# Patient Record
Sex: Female | Born: 1935 | Race: White | Hispanic: No | Smoking: Former smoker
Health system: Southern US, Community
[De-identification: ages and names within clinical notes are randomized; demographics above are authoritative.]

## PROBLEM LIST (undated history)

## (undated) DIAGNOSIS — E271 Primary adrenocortical insufficiency: Secondary | ICD-10-CM

## (undated) DIAGNOSIS — E785 Hyperlipidemia, unspecified: Secondary | ICD-10-CM

## (undated) DIAGNOSIS — G43909 Migraine, unspecified, not intractable, without status migrainosus: Secondary | ICD-10-CM

## (undated) DIAGNOSIS — E039 Hypothyroidism, unspecified: Secondary | ICD-10-CM

## (undated) HISTORY — DX: Hypothyroidism, unspecified: E03.9

## (undated) HISTORY — DX: Migraine, unspecified, not intractable, without status migrainosus: G43.909

## (undated) HISTORY — DX: Primary adrenocortical insufficiency: E27.1

## (undated) HISTORY — PX: OTHER SURGICAL HISTORY: SHX169

## (undated) HISTORY — DX: Hyperlipidemia, unspecified: E78.5

---

## 2004-11-20 ENCOUNTER — Encounter: Admission: RE | Admit: 2004-11-20 | Discharge: 2004-11-20 | Payer: Self-pay | Admitting: Family Medicine

## 2008-02-05 ENCOUNTER — Inpatient Hospital Stay (HOSPITAL_COMMUNITY): Admission: EM | Admit: 2008-02-05 | Discharge: 2008-02-08 | Payer: Self-pay | Admitting: Emergency Medicine

## 2008-10-27 ENCOUNTER — Ambulatory Visit: Payer: Self-pay | Admitting: Gastroenterology

## 2009-01-24 ENCOUNTER — Encounter: Payer: Self-pay | Admitting: Cardiology

## 2011-02-26 NOTE — Discharge Summary (Signed)
[name], [name]               ACCOUNT NO.:  [id number]   MEDICAL RECORD NO.:  [id number]          PATIENT TYPE:  INP   LOCATION:  5034                         FACILITY:  [facility]   PHYSICIAN:  Hettie Holstein, D.O.    DATE OF BIRTH:  05/02/1932   DATE OF ADMISSION:  02/05/2008  DATE OF DISCHARGE:  02/08/2008                               DISCHARGE SUMMARY   PRIMARY CARE PHYSICIAN:  Dr. Bayard Beaver. Collins Scotland, M.D.   FINAL DIAGNOSES:  A gram-negative rod sepsis of uncertain etiology,  final CNS is pending at time of discharge.  The patient requested  discharge home.  She is clinically stabilized and no local sources  identified.  She has received Rocephin and has responded clinically and  will be transitioned to Keflex.  I have contacted Dr. Herb Grays to  follow up the results of these final cultures and also provided the  patient with our number so that she can be contacted.  I have also  obtained her phone number to contact in case we find an isolate that is  non susceptible to her discharge regimen.  Her phone number is [phone].  1. Addison's disease with a mild crisis exacerbated by underlying      infection.  She has responded to a pulse stress dose of her      glucocorticoid which she can continue into the outpatient setting      upon followup.  If complete resolution of her symptoms and a      susceptible isolate is identified this can be tapered.  2. Strep pharyngitis.  She had sore throat and underwent a rapid strep      screen which was positive in the emergency department.  She      responded to Rocephin as noted above.  Additionally, she can      conclude a course of treatment with Keflex.  3. Hypothyroidism.   MEDICATIONS:  On discharge, Florinef she can continue at 0.05 mg daily,  hydrocortisone this is being increased at 30 mg q.a.m. and 20 mg p.o.  nightly, and levothyroxine 125 mcg daily as well as Keflex 500 mg p.o.  b.i.d.   DISPOSITION:  She is ambulatory,  clinically stable appearing condition.  With close followup should be able to discharge home.  I have contacted  her primary care physician and also I have obtained her contact  information in any event results of the cultures are unexpected.   HISTORY OF PRESENT ILLNESS:  For full details please refer to the H&P as  dictated by Dr. Isidor Holts however, briefly, Ms. [name] is a  pleasant 77 year old female who has known history of Addison's disease  on glucocorticoid and mineralocorticoid replacement.  She was feeling  quite well in her usual state of health up until the evening of April 23  when she developed a sore throat.  She denied nasal congestion and  denied cough.  She did feel febrile and took some Tylenol.  She  experienced some chills in the morning of April 24.  She vomited twice.  She denied abdominal  pain or diarrhea.  She contacted her primary MD, he  referred her to the emergency department.  She denied any sick contacts.   PAST MEDICAL HISTORY:  History of Addison's disease since age 34,  hypothyroidism, and hypertriglyceridemia.   HOSPITAL COURSE:  She was admitted and underwent initiation of treatment  for strep pharyngitis with Rocephin and intravenous fluids.  She  underwent a stress dosing of her hydrocortisone b.i.d.  She did exhibit  some orthostasis, hypotension, and fevers though she responded promptly  to IV fluids and pulse dosing.  Her blood cultures did reveal a  preliminary result gram-negative rods.  The final strain and  susceptibility is currently pending.  Ms. Gregg is clinically  appearing quite resolved, well and desires to go home and assures that  she will closely  followup with her primary care physician.  I have provided contact  information for her.  I will contact her primary care physician.  I have  obtained her contact information and we will follow her closely in the  event that these are not resolved prior to her following up with  her  primary care physician.      Hettie Holstein, D.O.  Electronically Signed     ESS/MEDQ  D:  02/08/2008  T:  02/09/2008  Job:  [id number]   cc:   Tammy R. Collins Scotland, M.D.

## 2011-02-26 NOTE — H&P (Signed)
[name], [name]               ACCOUNT NO.:  [id number]   MEDICAL RECORD NO.:  [id number]          PATIENT TYPE:  EMS   LOCATION:  MAJO                         FACILITY:  [facility]   PHYSICIAN:  Isidor Holts, M.D.  DATE OF BIRTH:  06/14/1932   DATE OF ADMISSION:  02/05/2008  DATE OF DISCHARGE:                              HISTORY & PHYSICAL   PRIMARY MEDICAL DOCTOR:  Tammy R. Collins Scotland, M.D.   CHIEF COMPLAINT:  Sore throat and fever for approximately 2 days,  vomiting for 1 day.   HISTORY OF PRESENT ILLNESS:  This is a 77 year old female. For past  medical history, see below.  The patient has a known history Addison  disease, on steroid replacement treatment.  According to her, she was  feeling quite well and in her usual state of health, up until  approximately 6 p.m. on February 04, 2008, when she developed a sore  throat.  She denies nasal congestion, denies cough.  She did, however,  feel febrile and had to take a couple of Tylenol overnight.  She  experienced chills in the a.m. of February 05, 2008 at about 8:30 to 8:45  a.m.  She took some Tylenol pills and started vomiting.  She vomited  twice, denies abdominal pain or diarrhea.  She contacted her M.D. who  referred her to the emergency department.  The patient denies any sick  contacts.  She denies chest pain or shortness of breath.   PAST MEDICAL HISTORY:  1. Addison disease since age 89 years.  2. Hypothyroidism.  3. Hypertriglyceridemia.   MEDICATION HISTORY:  1. Fludrocortisone 0.05 mg p.o. daily.  2. Hydrocortisone 20 mg p.o. every morning and 10 mg p.o. every      evening.  3. Levothyroxine 125 mcg p.o. daily.  4. Omega-3 fatty acids 1000 mg p.o. b.i.d.   ALLERGIES:  CODEINE, this causes nausea and vomiting.   SYSTEMS REVIEW:  As per HPI and chief complaint, otherwise negative.   SOCIAL HISTORY:  The patient is a retired Art gallery manager.  She has been  widowed for approximately 6 years.  She is an ex-smoker, quit  approximately 30 years ago.  Drinks alcohol only occasionally.  Has no  history of drug abuse.   FAMILY HISTORY:  The patient has two offspring.  Mother is age 40 years  and is alive and well.  Her father died in his 16s of carcinoma of the  lung.  He was a heavy smoker.  Family history is otherwise  noncontributory.   PHYSICAL EXAMINATION:  VITAL SIGNS:  Temperature 101.3, pulse 81 per  minute, respiratory rate 18, BP initially 93/56-mmHg, after normal  saline bolus administered intravenously in the emergency department  rechecked at 102/57-mmHg.  Pulse oximeter 97% on room air.  GENERAL:  The patient did not appear to be in obvious acute discomfort  at the time of this evaluation, alert, communicative, not short of  breath at rest.  HEENT:  No clinical pallor.  No jaundice.  No conjunctival injection.  Throat, visible mucous membranes are dry.  There was mild faucal  hyperemia, mild tonsillar enlargement,  but no exudate.  NECK:  Supple.  JVP not seen.  No palpable lymphadenopathy.  No palpable  goiter.  CHEST:  Clinically clear to auscultation.  No wheezes.  No crackles.  HEART:  Sounds 1 and 2 heard.  Normal regular.  No murmurs.  ABDOMEN:  Full, soft, nontender.  No palpable organomegaly.  No palpable  masses.  Normal bowel sounds.  LOWER EXTREMITIES:  No pitting edema.  Palpable peripheral pulses.  MUSCULOSKELETAL:  Unremarkable.  CENTRAL NERVOUS SYSTEM:  No focal neurologic deficits on gross  examination.   INVESTIGATIONS:  CBC:  WBC 9.7, hemoglobin 13.5, hematocrit 39.4,  platelets 230.  Electrolytes:  Sodium 135, potassium 4, chloride 101,  CO2 24, BUN 19, creatinine 1.01, glucose 91.  AST 36, ALT 23, alkaline  phosphatase 51, lipase 19.  Urinalysis is negative.  Rapid strep test  done in the emergency department was positive for Group A Streptococcus.   ASSESSMENT/PLAN:  1. Acute Group A Streptococcal tonsillopharyngitis.  We shall manage      with intravenous Rocephin  and utilize as needed analgesics.   1. Vomiting/mild dehydration.  We shall manage with intravenous      hydration with normal saline.   1. Addisonian crisis.  This appears to be mild.  We shall manage with      parenteral supraphysiologic steroid treatment as well as      intravenous fluids, as outlined above.   1. Hypotension.  This is secondary to #s 2 and 3, and should respond      to above management measures.   1. Hypothyroidism.  We shall check thyroid profile and meanwhile      administer Synthroid intravenously in equivalent dose to continue      pre-admission regimen.   1. History of hypertriglyceridemia.  We will check a lipid profile.   Further management will depend on clinical course.      Isidor Holts, M.D.  Electronically Signed     CO/MEDQ  D:  02/05/2008  T:  02/05/2008  Job:  [id number]   cc:   Tammy R. Collins Scotland, M.D.

## 2011-07-09 LAB — CULTURE, BLOOD (ROUTINE X 2): Culture: NO GROWTH

## 2011-07-09 LAB — BASIC METABOLIC PANEL
BUN: 11
BUN: 11
BUN: 7
CO2: 24
Calcium: 8 — ABNORMAL LOW
Chloride: 106
Chloride: 107
Creatinine, Ser: 0.77
Creatinine, Ser: 0.79
Creatinine, Ser: 0.96
GFR calc Af Amer: >60
GFR calc Af Amer: >60
GFR calc non Af Amer: 57 — ABNORMAL LOW
Glucose, Bld: 106 — ABNORMAL HIGH

## 2011-07-09 LAB — CBC
MCHC: 35.1
MCV: 90.9
Platelets: 208
Platelets: 230
RBC: 3.56 — ABNORMAL LOW
RDW: 13.5
WBC: 11.7 — ABNORMAL HIGH

## 2011-07-09 LAB — URINE MICROSCOPIC-ADD ON

## 2011-07-09 LAB — DIFFERENTIAL
Basophils Absolute: 0.1
Eosinophils Absolute: 0.1
Eosinophils Relative: 1
Lymphocytes Relative: 17
Neutrophils Relative %: 74

## 2011-07-09 LAB — B-NATRIURETIC PEPTIDE (CONVERTED LAB)
Pro B Natriuretic peptide (BNP): 192 — ABNORMAL HIGH
Pro B Natriuretic peptide (BNP): 324 — ABNORMAL HIGH

## 2011-07-09 LAB — URINALYSIS, ROUTINE W REFLEX MICROSCOPIC
Bilirubin Urine: NEGATIVE
Specific Gravity, Urine: 1.022
pH: 5

## 2011-07-09 LAB — COMPREHENSIVE METABOLIC PANEL
Albumin: 3.6
BUN: 19
GFR calc non Af Amer: 54 — ABNORMAL LOW
Potassium: 4
Sodium: 135
Total Bilirubin: 1.2
Total Protein: 6.7

## 2011-07-09 LAB — LIPASE, BLOOD: Lipase: 19

## 2011-07-09 LAB — LIPID PANEL
Cholesterol: 153
LDL Cholesterol: 81
Triglycerides: 101

## 2011-07-09 LAB — RAPID STREP SCREEN (MED CTR MEBANE ONLY): Streptococcus, Group A Screen (Direct): POSITIVE — AB

## 2011-07-09 LAB — TSH: TSH: 0.554

## 2011-08-27 ENCOUNTER — Ambulatory Visit: Payer: Medicare Other | Attending: Internal Medicine | Admitting: Physical Therapy

## 2011-08-27 DIAGNOSIS — R293 Abnormal posture: Secondary | ICD-10-CM | POA: Insufficient documentation

## 2011-08-27 DIAGNOSIS — IMO0001 Reserved for inherently not codable concepts without codable children: Secondary | ICD-10-CM | POA: Insufficient documentation

## 2011-09-11 ENCOUNTER — Encounter: Payer: Medicare Other | Admitting: Physical Therapy

## 2011-09-12 ENCOUNTER — Encounter: Payer: Medicare Other | Admitting: Physical Therapy

## 2011-09-17 ENCOUNTER — Encounter: Payer: Medicare Other | Admitting: Physical Therapy

## 2011-09-19 ENCOUNTER — Encounter: Payer: Medicare Other | Admitting: Physical Therapy

## 2011-09-24 ENCOUNTER — Ambulatory Visit: Payer: Medicare Other | Attending: Internal Medicine | Admitting: Physical Therapy

## 2011-09-24 DIAGNOSIS — R293 Abnormal posture: Secondary | ICD-10-CM | POA: Insufficient documentation

## 2011-09-24 DIAGNOSIS — IMO0001 Reserved for inherently not codable concepts without codable children: Secondary | ICD-10-CM | POA: Insufficient documentation

## 2011-10-02 ENCOUNTER — Ambulatory Visit: Payer: Medicare Other | Admitting: Physical Therapy

## 2011-10-23 ENCOUNTER — Ambulatory Visit: Payer: Medicare Other | Attending: Internal Medicine | Admitting: Physical Therapy

## 2011-10-23 DIAGNOSIS — IMO0001 Reserved for inherently not codable concepts without codable children: Secondary | ICD-10-CM | POA: Insufficient documentation

## 2011-10-23 DIAGNOSIS — R293 Abnormal posture: Secondary | ICD-10-CM | POA: Insufficient documentation

## 2011-11-06 ENCOUNTER — Ambulatory Visit: Payer: Medicare Other | Admitting: Physical Therapy

## 2012-06-30 ENCOUNTER — Emergency Department (HOSPITAL_COMMUNITY)
Admission: EM | Admit: 2012-06-30 | Discharge: 2012-06-30 | Disposition: A | Discharge status: Home / Self Care | Payer: Medicare Other | Attending: Emergency Medicine | Admitting: Emergency Medicine

## 2012-06-30 ENCOUNTER — Encounter (HOSPITAL_COMMUNITY): Payer: Self-pay

## 2012-06-30 DIAGNOSIS — R197 Diarrhea, unspecified: Secondary | ICD-10-CM | POA: Insufficient documentation

## 2012-06-30 DIAGNOSIS — E039 Hypothyroidism, unspecified: Secondary | ICD-10-CM | POA: Insufficient documentation

## 2012-06-30 DIAGNOSIS — M810 Age-related osteoporosis without current pathological fracture: Secondary | ICD-10-CM | POA: Insufficient documentation

## 2012-06-30 DIAGNOSIS — K529 Noninfective gastroenteritis and colitis, unspecified: Secondary | ICD-10-CM

## 2012-06-30 DIAGNOSIS — E785 Hyperlipidemia, unspecified: Secondary | ICD-10-CM | POA: Insufficient documentation

## 2012-06-30 DIAGNOSIS — E2749 Other adrenocortical insufficiency: Secondary | ICD-10-CM | POA: Insufficient documentation

## 2012-06-30 DIAGNOSIS — Z87891 Personal history of nicotine dependence: Secondary | ICD-10-CM | POA: Insufficient documentation

## 2012-06-30 DIAGNOSIS — R1110 Vomiting, unspecified: Secondary | ICD-10-CM | POA: Insufficient documentation

## 2012-06-30 LAB — URINALYSIS, ROUTINE W REFLEX MICROSCOPIC
Nitrite: NEGATIVE
Protein, ur: NEGATIVE mg/dL
Specific Gravity, Urine: 1.023 (ref 1.005–1.030)
Urobilinogen, UA: 0.2 mg/dL (ref 0.0–1.0)

## 2012-06-30 LAB — CBC WITH DIFFERENTIAL/PLATELET
Basophils Absolute: 0 10*3/uL (ref 0.0–0.1)
Eosinophils Relative: 1 % (ref 0–5)
HCT: 43.8 % (ref 36.0–46.0)
Lymphocytes Relative: 19 % (ref 12–46)
Lymphs Abs: 2.2 10*3/uL (ref 0.7–4.0)
MCV: 91.6 fL (ref 78.0–100.0)
Monocytes Absolute: 0.4 10*3/uL (ref 0.1–1.0)
Monocytes Relative: 4 % (ref 3–12)
RDW: 13.2 % (ref 11.5–15.5)
WBC: 11.6 10*3/uL — ABNORMAL HIGH (ref 4.0–10.5)

## 2012-06-30 LAB — COMPREHENSIVE METABOLIC PANEL
BUN: 25 mg/dL — ABNORMAL HIGH (ref 6–23)
CO2: 30 mEq/L (ref 19–32)
Calcium: 10 mg/dL (ref 8.4–10.5)
Creatinine, Ser: 1.01 mg/dL (ref 0.50–1.10)
GFR calc Af Amer: 60 mL/min — ABNORMAL LOW (ref >90)
GFR calc non Af Amer: 52 mL/min — ABNORMAL LOW (ref >90)
Glucose, Bld: 95 mg/dL (ref 70–99)

## 2012-06-30 LAB — URINE MICROSCOPIC-ADD ON

## 2012-06-30 MED ORDER — SODIUM CHLORIDE 0.9 % IV BOLUS (SEPSIS)
500.0000 mL | Freq: Once | INTRAVENOUS | Status: DC
Start: 2012-06-30 — End: 2012-07-01

## 2012-06-30 MED ORDER — IBUPROFEN 400 MG PO TABS
400.0000 mg | ORAL_TABLET | Freq: Once | ORAL | Status: AC
  Administered 2012-06-30: 400 mg via ORAL
  Filled 2012-06-30: qty 1

## 2012-06-30 MED ORDER — ONDANSETRON 4 MG PO TBDP: 4.0000 mg | ORAL_TABLET | Freq: Three times a day (TID) | ORAL | Status: DC | PRN

## 2012-06-30 MED ORDER — ONDANSETRON 4 MG PO TBDP
8.0000 mg | ORAL_TABLET | Freq: Once | ORAL | Status: AC
  Administered 2012-06-30: 8 mg via ORAL
  Filled 2012-06-30: qty 2

## 2012-06-30 NOTE — ED Provider Notes (Signed)
History     CSN: [id number]  Arrival date & time 06/30/12  4696   First MD Initiated Contact with Patient 06/30/12 2106      Chief Complaint  Patient presents with  . Emesis  . Diarrhea    (Consider location/radiation/quality/duration/timing/severity/associated sxs/prior treatment) HPI Pt with history of Addison's disease has onset of multiple episodes of vomiting and diarrhea earlier this afternoon. She denies any blood in emesis or stool. No fever or abdominal pain. She is concerned that her symptoms may be an Addisonian crisis, but has been taking her medications without difficulty.   Past Medical History  Diagnosis Date  . Hypothyroidism   . Addison's disease   . Osteoporosis   . Migraines     opthalmic migraines  . Dyslipidemia     Past Surgical History  Procedure Date  . Ganglion cyst foot     Family History  Problem Relation Age of Onset  . Lung cancer Father   . Angina Father   . Hypertension Brother   . Ovarian cancer Sister     History  Substance Use Topics  . Smoking status: Former Smoker -- 0.5 packs/day for 7 years    Types: Cigarettes    Quit date: 01/30/1979  . Smokeless tobacco: Not on file  . Alcohol Use: 3.5 oz/week    7 drink(s) per week    OB History    Grav Para Term Preterm Abortions TAB SAB Ect Mult Living                  Review of Systems All other systems reviewed and are negative except as noted in HPI.   Allergies  Review of patient's allergies indicates no known allergies.  Home Medications   Current Outpatient Rx  Name Route Sig Dispense Refill  . CALCIUM CARBONATE 600 MG PO TABS Oral Take 600 mg by mouth 2 (two) times daily with a meal.    . FLUDROCORTISONE ACETATE PO Oral Take 1 tablet by mouth daily.    Marland Kitchen HYDROCORTISONE 20 MG PO TABS Oral Take 5-10 mg by mouth daily. One half tab am and one tab pm    . LEVOTHYROXINE SODIUM 88 MCG PO TABS Oral Take 88 mcg by mouth daily.    Marland Kitchen NAPROXEN SODIUM 220 MG PO TABS Oral  Take 220 mg by mouth daily as needed. For pain    . OMEGA-3-ACID ETHYL ESTERS 1 G PO CAPS Oral Take 1 g by mouth 2 (two) times daily.      BP 137/58  Pulse 94  Temp 98.1 F (36.7 C) (Oral)  Resp 18  SpO2 96%  Physical Exam  Nursing note and vitals reviewed. Constitutional: She is oriented to person, place, and time. She appears well-developed and well-nourished.  HENT:  Head: Normocephalic and atraumatic.  Eyes: EOM are normal. Pupils are equal, round, and reactive to light.  Neck: Normal range of motion. Neck supple.  Cardiovascular: Normal rate, normal heart sounds and intact distal pulses.   Pulmonary/Chest: Effort normal and breath sounds normal.  Abdominal: Bowel sounds are normal. She exhibits no distension. There is no tenderness.  Musculoskeletal: Normal range of motion. She exhibits no edema and no tenderness.  Neurological: She is alert and oriented to person, place, and time. She has normal strength. No cranial nerve deficit or sensory deficit.  Skin: Skin is warm and dry. No rash noted.  Psychiatric: She has a normal mood and affect.    ED Course  Procedures (including critical  care time)  Labs Reviewed  CBC WITH DIFFERENTIAL - Abnormal; Notable for the following:    WBC 11.6 (*)     Neutro Abs 8.9 (*)     All other components within normal limits  COMPREHENSIVE METABOLIC PANEL - Abnormal; Notable for the following:    BUN 25 (*)     GFR calc non Af Amer 52 (*)     GFR calc Af Amer 60 (*)     All other components within normal limits  URINALYSIS, ROUTINE W REFLEX MICROSCOPIC - Abnormal; Notable for the following:    APPearance HAZY (*)     Hgb urine dipstick SMALL (*)     Ketones, ur 15 (*)     Leukocytes, UA TRACE (*)     All other components within normal limits  URINE MICROSCOPIC-ADD ON - Abnormal; Notable for the following:    Casts HYALINE CASTS (*)     All other components within normal limits   No results found.   No diagnosis found.    MDM    Labs in the ED are unremarkable. No significant hypotension. Doubt Addisonian Crisis. Her symptoms are resolved with Zofran. Attempting PO trial now and if tolerates, plan to D/C home. Asking for NSAID for bursitis.         Wania Longstreth B. Bernette Mayers, MD 06/30/12 2237

## 2012-06-30 NOTE — ED Notes (Signed)
Continuous vomiting in triage

## 2012-06-30 NOTE — ED Notes (Signed)
Pt reports she has a hx of Addison's disease, approx 1645 today pt began having N/V/D, pt reports vomiting x6 and diarrhea x3. Pt denies chest/back/abd pain

## 2012-11-03 ENCOUNTER — Emergency Department (HOSPITAL_COMMUNITY)
Admission: EM | Admit: 2012-11-03 | Discharge: 2012-11-03 | Disposition: A | Discharge status: Home / Self Care | Payer: Medicare Other | Attending: Emergency Medicine | Admitting: Emergency Medicine

## 2012-11-03 ENCOUNTER — Encounter (HOSPITAL_COMMUNITY): Payer: Self-pay

## 2012-11-03 ENCOUNTER — Emergency Department (HOSPITAL_COMMUNITY): Payer: Medicare Other

## 2012-11-03 DIAGNOSIS — R112 Nausea with vomiting, unspecified: Secondary | ICD-10-CM | POA: Insufficient documentation

## 2012-11-03 DIAGNOSIS — E785 Hyperlipidemia, unspecified: Secondary | ICD-10-CM | POA: Insufficient documentation

## 2012-11-03 DIAGNOSIS — Z8679 Personal history of other diseases of the circulatory system: Secondary | ICD-10-CM | POA: Insufficient documentation

## 2012-11-03 DIAGNOSIS — Z87891 Personal history of nicotine dependence: Secondary | ICD-10-CM | POA: Insufficient documentation

## 2012-11-03 DIAGNOSIS — E039 Hypothyroidism, unspecified: Secondary | ICD-10-CM | POA: Insufficient documentation

## 2012-11-03 DIAGNOSIS — Z79899 Other long term (current) drug therapy: Secondary | ICD-10-CM | POA: Insufficient documentation

## 2012-11-03 DIAGNOSIS — E2749 Other adrenocortical insufficiency: Secondary | ICD-10-CM | POA: Insufficient documentation

## 2012-11-03 DIAGNOSIS — M810 Age-related osteoporosis without current pathological fracture: Secondary | ICD-10-CM | POA: Insufficient documentation

## 2012-11-03 LAB — URINALYSIS, ROUTINE W REFLEX MICROSCOPIC
Glucose, UA: NEGATIVE mg/dL
Ketones, ur: NEGATIVE mg/dL
Nitrite: NEGATIVE
Protein, ur: NEGATIVE mg/dL
Urobilinogen, UA: 0.2 mg/dL (ref 0.0–1.0)

## 2012-11-03 LAB — COMPREHENSIVE METABOLIC PANEL
AST: 30 U/L (ref 0–37)
BUN: 24 mg/dL — ABNORMAL HIGH (ref 6–23)
CO2: 29 mEq/L (ref 19–32)
Calcium: 9.4 mg/dL (ref 8.4–10.5)
Chloride: 98 mEq/L (ref 96–112)
Creatinine, Ser: 0.96 mg/dL (ref 0.50–1.10)
GFR calc Af Amer: 64 mL/min — ABNORMAL LOW (ref >90)
GFR calc non Af Amer: 55 mL/min — ABNORMAL LOW (ref >90)
Total Bilirubin: 0.5 mg/dL (ref 0.3–1.2)

## 2012-11-03 LAB — CBC WITH DIFFERENTIAL/PLATELET
Basophils Absolute: 0 10*3/uL (ref 0.0–0.1)
Eosinophils Relative: 1 % (ref 0–5)
HCT: 42.5 % (ref 36.0–46.0)
Hemoglobin: 14.4 g/dL (ref 12.0–15.0)
Lymphocytes Relative: 12 % (ref 12–46)
MCHC: 33.9 g/dL (ref 30.0–36.0)
MCV: 91.4 fL (ref 78.0–100.0)
Monocytes Absolute: 0.4 10*3/uL (ref 0.1–1.0)
Monocytes Relative: 6 % (ref 3–12)
RDW: 13.5 % (ref 11.5–15.5)
WBC: 7.1 10*3/uL (ref 4.0–10.5)

## 2012-11-03 LAB — LIPASE, BLOOD: Lipase: 130 U/L — ABNORMAL HIGH (ref 11–59)

## 2012-11-03 LAB — TROPONIN I: Troponin I: <0.3 ng/mL (ref <0.30)

## 2012-11-03 MED ORDER — SODIUM CHLORIDE 0.9 % IV BOLUS (SEPSIS)
250.0000 mL | Freq: Once | INTRAVENOUS | Status: AC
  Administered 2012-11-03: 250 mL via INTRAVENOUS

## 2012-11-03 MED ORDER — ONDANSETRON HCL 4 MG/2ML IJ SOLN
4.0000 mg | Freq: Once | INTRAMUSCULAR | Status: AC
  Administered 2012-11-03: 4 mg via INTRAVENOUS
  Filled 2012-11-03: qty 2

## 2012-11-03 MED ORDER — SODIUM CHLORIDE 0.9 % IV SOLN
Freq: Once | INTRAVENOUS | Status: AC
  Administered 2012-11-03: 10:00:00 via INTRAVENOUS

## 2012-11-03 NOTE — ED Notes (Signed)
Pt transported to radiology.

## 2012-11-03 NOTE — ED Notes (Signed)
Patient is able to ambulate without any problems. 

## 2012-11-03 NOTE — ED Notes (Signed)
Pt. Woke up with n/v vomited about 7 times.  Denies any abdominal pain

## 2012-11-03 NOTE — ED Provider Notes (Signed)
History     CSN: [id number]  Arrival date & time 11/03/12  4098   First MD Initiated Contact with Patient 11/03/12 0848      Chief Complaint  Patient presents with  . Emesis    Patient is a 79 y.o. female presenting with vomiting. The history is provided by the patient and a relative.  Emesis  This is a new problem. Episode onset: just prior to arrival. The problem occurs 5 to 10 times per day. The problem has been gradually worsening. The emesis has an appearance of stomach contents. There has been no fever. Pertinent negatives include no abdominal pain, no diarrhea and no fever. Risk factors: unknown.  pt reports abrupt onset of nauseas/vomiting.  No diarrhea.  No blood reported in her vomitus.  No cp/sob.  No abd pain.  She reports she has had this before as he has h/o addisons disease.  She has not missed any steroid treatments   Past Medical History  Diagnosis Date  . Hypothyroidism   . Addison's disease   . Osteoporosis   . Migraines     opthalmic migraines  . Dyslipidemia     Past Surgical History  Procedure Date  . Ganglion cyst foot     Family History  Problem Relation Age of Onset  . Lung cancer Father   . Angina Father   . Hypertension Brother   . Ovarian cancer Sister     History  Substance Use Topics  . Smoking status: Former Smoker -- 0.5 packs/day for 7 years    Types: Cigarettes    Quit date: 01/30/1979  . Smokeless tobacco: Not on file  . Alcohol Use: 3.5 oz/week    7 drink(s) per week    OB History    Grav Para Term Preterm Abortions TAB SAB Ect Mult Living                  Review of Systems  Constitutional: Negative for fever.  Respiratory: Negative for shortness of breath.   Cardiovascular: Negative for chest pain.  Gastrointestinal: Positive for vomiting. Negative for abdominal pain and diarrhea.  Neurological: Negative for syncope.  Psychiatric/Behavioral: Negative for agitation.  All other systems reviewed and are  negative.    Allergies  Review of patient's allergies indicates no known allergies.  Home Medications   Current Outpatient Rx  Name  Route  Sig  Dispense  Refill  . CALCIUM PO   Oral   Take 1 tablet by mouth daily.         Marland Kitchen FLUDROCORTISONE ACETATE 0.1 MG PO TABS   Oral   Take 0.05 mg by mouth daily.         Marland Kitchen HYDROCORTISONE 10 MG PO TABS   Oral   Take 5-15 mg by mouth 2 (two) times daily. Take 1 & 1/2 tabs between 6-8 am & 1/2 tab between 3-5 pm (double dose on sick days)         . LEVOTHYROXINE SODIUM 88 MCG PO TABS   Oral   Take 88 mcg by mouth daily.         Marland Kitchen NAPROXEN SODIUM 220 MG PO TABS   Oral   Take 220 mg by mouth daily as needed. For pain         . OMEGA-3-ACID ETHYL ESTERS 1 G PO CAPS   Oral   Take 1 g by mouth daily.          Marland Kitchen ONDANSETRON 4 MG PO TBDP  Oral   Take 1 tablet (4 mg total) by mouth every 8 (eight) hours as needed for nausea.   20 tablet   0     BP 114/58  Pulse 85  Temp 98.2 F (36.8 C) (Oral)  Resp 18  SpO2 94% BP 100/47  Pulse 96  Temp 98.2 F (36.8 C) (Oral)  Resp 18  SpO2 98%   Physical Exam CONSTITUTIONAL: Well developed/well nourished HEAD AND FACE: Normocephalic/atraumatic EYES: EOMI/PERRL, no icterus ENMT: Mucous membranes dry NECK: supple no meningeal signs SPINE:entire spine nontender CV: S1/S2 noted, no murmurs/rubs/gallops noted LUNGS: Lungs are clear to auscultation bilaterally, no apparent distress ABDOMEN: soft, nontender, no rebound or guarding GU:no cva tenderness NEURO: Pt is awake/alert, moves all extremitiesx4 EXTREMITIES: pulses normal, full ROM SKIN: warm, color normal PSYCH: no abnormalities of mood noted  ED Course  Procedures  Labs Reviewed  CBC WITH DIFFERENTIAL - Abnormal; Notable for the following:    Neutrophils Relative 81 (*)     All other components within normal limits  COMPREHENSIVE METABOLIC PANEL - Abnormal; Notable for the following:    Glucose, Bld 101 (*)      BUN 24 (*)     GFR calc non Af Amer 55 (*)     GFR calc Af Amer 64 (*)     All other components within normal limits  LIPASE, BLOOD - Abnormal; Notable for the following:    Lipase 130 (*)     All other components within normal limits   10:07 AM Doubt addisonian crisis.  Labs reassuring thus far. 11:39 AM Spoke to dr Sharl Ma, on for endocrinology (who this patient sees) Recommends CXR/urine/ischemic eval If unremarkable, increase cortef to 15mg  TID this week and he will call for appointment Pt is feeling improved 1:56 PM Pt improved.  She denies pain.  No active vomiting.  She is ambulatory Stable for d/c  MDM  Nursing notes including past medical history and social history reviewed and considered in documentation Labs/vital reviewed and considered Previous records reviewed and considered - previous ED visits reviewed xrays reviewed and considered        Date: 11/03/2012  Rate: 86  Rhythm: normal sinus rhythm  QRS Axis: normal  Intervals: normal  ST/T Wave abnormalities: nonspecific ST changes  Conduction Disutrbances:left bundle branch block  Narrative Interpretation: pvc noted  Old EKG Reviewed: no significant change    Joya Gaskins, MD 11/03/12 1356

## 2012-11-03 NOTE — ED Notes (Signed)
Pt returned from radiology.

## 2012-11-03 NOTE — ED Notes (Signed)
IV nurse at the bedside

## 2012-11-03 NOTE — ED Notes (Signed)
Pt tolerating po ice chips

## 2012-11-03 NOTE — ED Notes (Signed)
Pt. Reports, "I feel a little better." Pt. Asked for something to  Drink, gave her a mouth swab.  Will see how she does with that. And then will offer fluids.

## 2013-01-19 ENCOUNTER — Other Ambulatory Visit (HOSPITAL_COMMUNITY): Payer: Self-pay | Admitting: Urology

## 2013-01-19 DIAGNOSIS — N281 Cyst of kidney, acquired: Secondary | ICD-10-CM

## 2013-02-19 ENCOUNTER — Ambulatory Visit (HOSPITAL_COMMUNITY): Payer: Medicare Other

## 2019-07-23 ENCOUNTER — Ambulatory Visit: Payer: Self-pay | Admitting: Cardiology

## 2020-05-31 ENCOUNTER — Telehealth: Payer: Self-pay

## 2020-05-31 NOTE — Telephone Encounter (Signed)
Pt called to inform us that she had some flutter and heart pounding last night. Pt mention that her PCP informed her to go to the ER or an Urgent Care. Please advise

## 2020-06-01 ENCOUNTER — Telehealth: Payer: Self-pay

## 2020-06-01 NOTE — Telephone Encounter (Signed)
Send message to lucia to call pt to make an appt

## 2020-06-01 NOTE — Telephone Encounter (Signed)
If no further symptoms, just watch, otherwise have a monitor placed. Also needs an OV and I do not recollect when I had last seen her

## 2020-06-13 ENCOUNTER — Other Ambulatory Visit: Payer: Self-pay

## 2020-06-13 ENCOUNTER — Encounter: Payer: Self-pay | Admitting: Cardiology

## 2020-06-13 ENCOUNTER — Ambulatory Visit: Payer: Medicare Other | Admitting: Cardiology

## 2020-06-13 ENCOUNTER — Ambulatory Visit: Payer: Medicare Other

## 2020-06-13 VITALS — BP 128/65 | HR 74 | Resp 16 | Ht 67.0 in | Wt 129.6 lb

## 2020-06-13 DIAGNOSIS — I447 Left bundle-branch block, unspecified: Secondary | ICD-10-CM

## 2020-06-13 DIAGNOSIS — R0609 Other forms of dyspnea: Secondary | ICD-10-CM

## 2020-06-13 DIAGNOSIS — E039 Hypothyroidism, unspecified: Secondary | ICD-10-CM

## 2020-06-13 DIAGNOSIS — R002 Palpitations: Secondary | ICD-10-CM

## 2020-06-13 DIAGNOSIS — E271 Primary adrenocortical insufficiency: Secondary | ICD-10-CM

## 2020-06-13 DIAGNOSIS — Z87891 Personal history of nicotine dependence: Secondary | ICD-10-CM

## 2020-06-13 NOTE — Progress Notes (Signed)
Irish Lack Date of Birth: 06-08-1932 MRN: [id number] Primary Care Provider:Badger, Rebeca Alert, MD Former Cardiology Providers: Dr. Adrian Prows Primary Cardiologist: Rex Kras, DO, [facility] (established care 06/13/2020)  Date: 06/13/2020 Last Visit: 07/22/2018  Chief Complaint  Patient presents with  . Follow-up    2 year  . Palpitations  . Shortness of Breath    HPI  [name] is a 86 y.o.  female who presents to the office with a chief complaint of " palpitations and shortness of breath." Patient's past medical history and cardiovascular risk factors include: Mild to moderate mitral regurgitation, left bundle branch block, Addison's disease, osteoarthritis, osteoporosis, postmenopausal female, advanced age.  Patient presents to the office for reevaluation of palpitations and shortness of breath.  Patient states that the palpitations started to resurface in mid August 2021. The first episode was while she was asleep she woke up in the middle of the night experiencing " pounding heartbeat" with intermittent flutters. She also thought that she had a few skipped beats and her symptoms resolved within 10 to 15 minutes. No improving or worsening factors. The symptoms were self-limited. No recurrence of symptoms since then.  In regards to dyspnea on exertion patient states that she gets short of breath more quicker with day-to-day activities. Initially she was contributing to not being so active given the COVID-19 pandemic but given her recent episodes of pounding heartbeat, palpitations she is concerned about underlying cardiac conditions and is here for reevaluation.  She was last seen by my partner on 08/02/2018. She had an echocardiogram and stress test at that time and results noted below.  Denies prior history of coronary artery disease, myocardial infarction, congestive heart failure, deep venous thrombosis, pulmonary embolism, stroke, transient ischemic attack.  FUNCTIONAL  STATUS: No structured exercise program or daily routine since COVID 19 pandemic.    ALLERGIES: No Known Allergies   MEDICATION LIST PRIOR TO VISIT: Current Outpatient Medications on File Prior to Visit  Medication Sig Dispense Refill  . CALCIUM PO Take 1 tablet by mouth daily.    . fludrocortisone (FLORINEF) 0.1 MG tablet Take 0.05 mg by mouth daily.    . hydrocortisone (CORTEF) 10 MG tablet Take 5-15 mg by mouth 2 (two) times daily. Take 1 & 1/2 tabs between 6-8 am & 1/2 tab between 3-5 pm (double dose on sick days)    . levothyroxine (SYNTHROID, LEVOTHROID) 88 MCG tablet Take 88 mcg by mouth daily.    Marland Kitchen omega-3 acid ethyl esters (LOVAZA) 1 G capsule Take 1 g by mouth daily.     . ondansetron (ZOFRAN ODT) 4 MG disintegrating tablet Take 1 tablet (4 mg total) by mouth every 8 (eight) hours as needed for nausea. 20 tablet 0   No current facility-administered medications on file prior to visit.    PAST MEDICAL HISTORY: Past Medical History:  Diagnosis Date  . Addison's disease ([place])   . Hypothyroidism   . Osteoporosis     PAST SURGICAL HISTORY: Past Surgical History:  Procedure Laterality Date  . ganglion cyst foot      FAMILY HISTORY: The patient's family history includes Angina in her father; Hypertension in her brother; Lung cancer in her father; Ovarian cancer in her sister.   SOCIAL HISTORY:  The patient  reports that she quit smoking about 41 years ago. Her smoking use included cigarettes. She has a 3.50 pack-year smoking history. She has never used smokeless tobacco. She reports current alcohol use of about 1.0 standard drink  of alcohol per week. She reports that she does not use drugs.  Review of Systems  Constitutional: Negative for chills and fever.  HENT: Negative for hoarse voice and nosebleeds.   Eyes: Negative for discharge, double vision and pain.  Cardiovascular: Positive for dyspnea on exertion and palpitations. Negative for chest pain, claudication, leg  swelling, near-syncope, orthopnea, paroxysmal nocturnal dyspnea and syncope.  Respiratory: Negative for hemoptysis and shortness of breath.   Musculoskeletal: Negative for muscle cramps and myalgias.  Gastrointestinal: Negative for abdominal pain, constipation, diarrhea, hematemesis, hematochezia, melena, nausea and vomiting.  Neurological: Negative for dizziness and light-headedness.    PHYSICAL EXAM: Vitals with BMI 06/13/2020 11/03/2012 11/03/2012  Height 5\' 7"  - -  Weight 129 lbs 10 oz - -  BMI 29.79 - -  Systolic 892 119 417  Diastolic 65 47 47  Pulse 74 96 95    CONSTITUTIONAL: Well-developed and well-nourished. No acute distress.  SKIN: Skin is warm and dry. No rash noted. No cyanosis. No pallor. No jaundice HEAD: Normocephalic and atraumatic.  EYES: No scleral icterus MOUTH/THROAT: Moist oral membranes.  NECK: No JVD present. No thyromegaly noted. No carotid bruits  LYMPHATIC: No visible cervical adenopathy.  CHEST Normal respiratory effort. No intercostal retractions  LUNGS: Clear to auscultation bilaterally. No stridor. No wheezes. No rales.  CARDIOVASCULAR: Positive S1-S2, regular, soft holosystolic murmur heard at the apex, no gallops or rubs. ABDOMINAL: Nonobese, soft, nontender, nondistended, positive bowel sounds in all 4 quadrants, no apparent ascites.  EXTREMITIES: No peripheral edema  HEMATOLOGIC: No significant bruising NEUROLOGIC: Oriented to person, place, and time. Nonfocal. Normal muscle tone.  PSYCHIATRIC: Normal mood and affect. Normal behavior. Cooperative  CARDIAC DATABASE: EKG: 06/13/2020: Normal sinus rhythm, 71 bpm, poor R wave progression, left bundle branch block, ST-T changes most likely secondary to LBBB but ischemia cannot be ruled out.  Echocardiogram: 07/08/2018: LVEF 40%, grade 1 diastolic impairment, mild LVH, mild TR, mild to moderate MR, mild TR, no pulmonary hypertension.  Stress Testing:  Lexiscan stress test 06/19/2018: Homogenous tracer  uptake throughout the myocardium. SPECT images revealed normal thickening and wall motion. LVEF by gated SPECT 65%. Low risk study.  Heart Catheterization: None  LABORATORY DATA: CBC Latest Ref Rng & Units 11/03/2012 06/30/2012 02/07/2008  WBC 4.0 - 10.5 K/uL 7.1 11.6(H) 10.0  Hemoglobin 12.0 - 15.0 g/dL 14.4 15.0 11.4(L)  Hematocrit 36 - 46 % 42.5 43.8 32.4(L)  Platelets 150 - 400 K/uL 223 239 220    CMP Latest Ref Rng & Units 11/03/2012 06/30/2012 02/08/2008  Glucose 70 - 99 mg/dL 101(H) 95 99  BUN 6 - 23 mg/dL 24(H) 25(H) 11  Creatinine 0.50 - 1.10 mg/dL 0.96 1.01 0.77  Sodium 135 - 145 mEq/L 138 140 142  Potassium 3.5 - 5.1 mEq/L 4.0 3.9 3.7 DELTA CHECK NOTED  Chloride 96 - 112 mEq/L 98 100 106  CO2 19 - 32 mEq/L 29 30 28   Calcium 8.4 - 10.5 mg/dL 9.4 10.0 8.7  Total Protein 6.0 - 8.3 g/dL 7.1 7.9 -  Total Bilirubin 0.3 - 1.2 mg/dL 0.5 0.4 -  Alkaline Phos 39 - 117 U/L 61 46 -  AST 0 - 37 U/L 30 25 -  ALT 0 - 35 U/L 18 14 -    IMPRESSION:    ICD-10-CM   1. Palpitations  R00.2 EKG 12-Lead  2. Dyspnea on exertion  R06.00   3. LBBB (left bundle branch block)  I44.7   4. Former smoker  Z87.891   5.  Hypothyroidism, unspecified type  E03.9   6. Addison's disease ([place])  E27.1      RECOMMENDATIONS: SU DUMA is a 86 y.o. female whose past medical history and cardiovascular risk factors include: Mild to moderate mitral regurgitation, left bundle branch block, Addison's disease, osteoarthritis, osteoporosis, postmenopausal female, advanced age.  Palpitations: Patient has had 1 episode of palpitations which woke her up from her sleep and lasted for about 10 to 15 minutes. She denies any near-syncope or syncopal event.  Patient had an echocardiogram and Lexiscan in 2019 results were reviewed with her and noted above for further reference. Recommend extended Holter monitor to evaluate for underlying dysrhythmias. Recommend getting her most recent blood work from her PCPs office  to evaluate the most recent TSH. Otherwise we will have to repeat it. Continue to monitor.  Left bundle branch block: Continue to monitor.  Addison's disease: Medications reconciled. Chronic and stable  If the patient's extended Holter monitor does not reveal any significant dysrhythmias will consider repeating an echocardiogram and stress test given her dyspnea on exertion. Patient is agreeable with the plan of care.   FINAL MEDICATION LIST END OF ENCOUNTER: No orders of the defined types were placed in this encounter.   Medications Discontinued During This Encounter  Medication Reason  . naproxen sodium (ANAPROX) 220 MG tablet Patient Preference     Current Outpatient Medications:  .  CALCIUM PO, Take 1 tablet by mouth daily., Disp: , Rfl:  .  fludrocortisone (FLORINEF) 0.1 MG tablet, Take 0.05 mg by mouth daily., Disp: , Rfl:  .  hydrocortisone (CORTEF) 10 MG tablet, Take 5-15 mg by mouth 2 (two) times daily. Take 1 & 1/2 tabs between 6-8 am & 1/2 tab between 3-5 pm (double dose on sick days), Disp: , Rfl:  .  levothyroxine (SYNTHROID, LEVOTHROID) 88 MCG tablet, Take 88 mcg by mouth daily., Disp: , Rfl:  .  omega-3 acid ethyl esters (LOVAZA) 1 G capsule, Take 1 g by mouth daily. , Disp: , Rfl:  .  ondansetron (ZOFRAN ODT) 4 MG disintegrating tablet, Take 1 tablet (4 mg total) by mouth every 8 (eight) hours as needed for nausea., Disp: 20 tablet, Rfl: 0  Orders Placed This Encounter  Procedures  . EKG 12-Lead   --Continue cardiac medications as reconciled in final medication list. --Return in about 4 weeks (around 07/11/2020) for re, re-evaluation of palpitations and review monitor results.. Or sooner if needed. --Continue follow-up with your primary care physician regarding the management of your other chronic comorbid conditions.  Patient's questions and concerns were addressed to her satisfaction. She voices understanding of the instructions provided during this encounter.    During this visit I reviewed and updated: Tobacco history  allergies medication reconciliation  medical history  surgical history  family history  social history.  This note was created using a voice recognition software as a result there may be grammatical errors inadvertently enclosed that do not reflect the nature of this encounter. Every attempt is made to correct such errors.  Total time spent: 45 minutes.  Rex Kras, [place], [facility]  Pager: [phone] Office: [phone]

## 2020-07-24 NOTE — Progress Notes (Signed)
Relayed information to patient. Patient voiced understanding.  

## 2020-07-25 ENCOUNTER — Encounter: Payer: Self-pay | Admitting: Cardiology

## 2020-07-25 ENCOUNTER — Other Ambulatory Visit: Payer: Self-pay

## 2020-07-25 ENCOUNTER — Ambulatory Visit: Payer: Medicare Other | Admitting: Cardiology

## 2020-07-25 VITALS — BP 132/61 | HR 78 | Resp 15 | Ht 67.0 in | Wt 130.0 lb

## 2020-07-25 DIAGNOSIS — Z87891 Personal history of nicotine dependence: Secondary | ICD-10-CM

## 2020-07-25 DIAGNOSIS — R002 Palpitations: Secondary | ICD-10-CM

## 2020-07-25 DIAGNOSIS — I447 Left bundle-branch block, unspecified: Secondary | ICD-10-CM

## 2020-07-25 DIAGNOSIS — E271 Primary adrenocortical insufficiency: Secondary | ICD-10-CM

## 2020-07-25 DIAGNOSIS — R0609 Other forms of dyspnea: Secondary | ICD-10-CM

## 2020-07-25 NOTE — Progress Notes (Signed)
Irish Lack Date of Birth: [birth year] MRN: [id number] Primary Care Provider:Badger, Rebeca Alert, MD Former Cardiology Providers: Dr. Adrian Prows Primary Cardiologist: Rex Kras, DO, [facility] (established care 06/13/2020)  Date: 07/25/20 Last Office Visit: 06/13/2020  Chief Complaint  Patient presents with   Follow-up    6 week   Palpitations   Results    HPI  [name] is a 86 y.o.  female who presents to the office with a chief complaint of " reevaluation of palpitations." Patient's past medical history and cardiovascular risk factors include: Mild to moderate mitral regurgitation, left bundle branch block, Addison's disease, osteoarthritis, osteoporosis, postmenopausal female, advanced age.  Patient presents to the office for reevaluation of palpitations and shortness of breath.  She is accompanied by her daughter [name] at today's visit.  Back in April 2021 she presented to the office for evaluation of palpitations and shortness of breath.  At that time the shared decision was to proceed with an extended Holter monitor as she had recently undergone ischemic evaluation in October 2019.  I reviewed the 14-day extended Holter monitor results with the patient and her daughter at today's visit.  Her average heart rate is 71 bpm.  No atrial fibrillation noted during the monitoring period.  She did have isolated PVCs and PVCs and bigeminal trigeminal pattern but the overall total ventricular ectopic burden was less than 1%.  In addition she had 4 asymptomatic episodes of PSVT with longest event being 8 beats in duration.  Patient states that her dyspnea on exertion remains chronic and stable.  Since last office visit she also notes having symptoms of tiredness and fatigue and low blood pressures.  Patient states that she wakes up in her systolic blood pressures usually are around 100 millimeters of mercury.  After she takes her medications for adrenal insufficiency she starts feeling  less tired and fatigued and her blood pressure is improved.  As a day progresses the symptoms resurface and usually get better after taking her Florinef and hydrocortisone.  Patient states that she makes a very good effort and consuming balanced meals and staying hydrated.  She denies any chest pain at rest or with effort related activities.  Her symptoms of dyspnea on exertion have not been progressive in intensity, frequency, and/or duration.  Denies prior history of coronary artery disease, myocardial infarction, congestive heart failure, deep venous thrombosis, pulmonary embolism, stroke, transient ischemic attack.  FUNCTIONAL STATUS: No structured exercise program or daily routine since COVID 19 pandemic.    ALLERGIES: No Known Allergies   MEDICATION LIST PRIOR TO VISIT: Current Outpatient Medications on File Prior to Visit  Medication Sig Dispense Refill   CALCIUM PO Take 1 tablet by mouth daily.     fludrocortisone (FLORINEF) 0.1 MG tablet Take 0.05 mg by mouth daily.     hydrocortisone (CORTEF) 10 MG tablet Take 5-15 mg by mouth 2 (two) times daily. Take 1 & 1/2 tabs between 6-8 am & 1/2 tab between 3-5 pm (double dose on sick days)     levothyroxine (SYNTHROID, LEVOTHROID) 88 MCG tablet Take 88 mcg by mouth daily.     omega-3 acid ethyl esters (LOVAZA) 1 G capsule Take 1 g by mouth daily.      ondansetron (ZOFRAN ODT) 4 MG disintegrating tablet Take 1 tablet (4 mg total) by mouth every 8 (eight) hours as needed for nausea. 20 tablet 0   No current facility-administered medications on file prior to visit.    PAST MEDICAL HISTORY:  Past Medical History:  Diagnosis Date   Addison's disease ([place])    Hypothyroidism    Osteoporosis     PAST SURGICAL HISTORY: Past Surgical History:  Procedure Laterality Date   ganglion cyst foot      FAMILY HISTORY: The patient's family history includes Angina in her father; Hypertension in her brother; Lung cancer in her father;  Ovarian cancer in her sister.   SOCIAL HISTORY:  The patient  reports that she quit smoking about 41 years ago. Her smoking use included cigarettes. She has a 3.50 pack-year smoking history. She has never used smokeless tobacco. She reports current alcohol use of about 1.0 standard drink of alcohol per week. She reports that she does not use drugs.  Review of Systems  Constitutional: Positive for malaise/fatigue. Negative for chills and fever.  HENT: Negative for hoarse voice and nosebleeds.   Eyes: Negative for discharge, double vision and pain.  Cardiovascular: Positive for dyspnea on exertion and palpitations (improved). Negative for chest pain, claudication, leg swelling, near-syncope, orthopnea, paroxysmal nocturnal dyspnea and syncope.  Respiratory: Negative for hemoptysis and shortness of breath.   Musculoskeletal: Negative for muscle cramps and myalgias.  Gastrointestinal: Negative for abdominal pain, constipation, diarrhea, hematemesis, hematochezia, melena, nausea and vomiting.  Neurological: Negative for dizziness and light-headedness.    PHYSICAL EXAM: Vitals with BMI 07/25/2020 06/13/2020 11/03/2012  Height 5\' 7"  5\' 7"  -  Weight 130 lbs 129 lbs 10 oz -  BMI 36.62 94.76 -  Systolic 546 503 546  Diastolic 61 65 47  Pulse 78 74 96    CONSTITUTIONAL: Well-developed and well-nourished. No acute distress.  SKIN: Skin is warm and dry. No rash noted. No cyanosis. No pallor. No jaundice HEAD: Normocephalic and atraumatic.  EYES: No scleral icterus MOUTH/THROAT: Moist oral membranes.  NECK: No JVD present. No thyromegaly noted. No carotid bruits  LYMPHATIC: No visible cervical adenopathy.  CHEST Normal respiratory effort. No intercostal retractions  LUNGS: Clear to auscultation bilaterally. No stridor. No wheezes. No rales.  CARDIOVASCULAR: Positive S1-S2, regular, soft holosystolic murmur heard at the apex, no gallops or rubs. ABDOMINAL: Nonobese, soft, nontender, nondistended,  positive bowel sounds in all 4 quadrants, no apparent ascites.  EXTREMITIES: No peripheral edema  HEMATOLOGIC: No significant bruising NEUROLOGIC: Oriented to person, place, and time. Nonfocal. Normal muscle tone.  PSYCHIATRIC: Normal mood and affect. Normal behavior. Cooperative  CARDIAC DATABASE: EKG: 06/13/2020: Normal sinus rhythm, 71 bpm, poor R wave progression, left bundle branch block, ST-T changes most likely secondary to LBBB but ischemia cannot be ruled out.  Echocardiogram: 07/08/2018: LVEF 56%, grade 1 diastolic impairment, mild LVH, mild TR, mild to moderate MR, mild TR, no pulmonary hypertension.  Stress Testing:  Lexiscan stress test 06/19/2018: Homogenous tracer uptake throughout the myocardium. SPECT images revealed normal thickening and wall motion. LVEF by gated SPECT 65%. Low risk study.  Heart Catheterization: None  14 day extended Holter monitor: Dominant rhythm normal sinus. Heart rate 51-156 bpm. Avg HR 71 bpm. No atrial fibrillation, ventricular tachycardia, high grade AV block, pauses (3 seconds or longer). Ventricular ectopy was <1% (rare isolated PVCs and in bigeminy & trigeminy pattern). Total supraventricular ectopic burden <1%. 4 SVT episodes with the fastest event 7 beats with a max HR 156 bpm and the longest event 8 beats in duration an average rate 108 bpm. Patient triggered event: One, Underlying rhythm NSR with ventricular couplet.   LABORATORY DATA: CBC Latest Ref Rng & Units 11/03/2012 06/30/2012 02/07/2008  WBC 4.0 - 10.5 K/uL  7.1 11.6(H) 10.0  Hemoglobin 12.0 - 15.0 g/dL 14.4 15.0 11.4(L)  Hematocrit 36 - 46 % 42.5 43.8 32.4(L)  Platelets 150 - 400 K/uL 223 239 220    CMP Latest Ref Rng & Units 11/03/2012 06/30/2012 02/08/2008  Glucose 70 - 99 mg/dL 101(H) 95 99  BUN 6 - 23 mg/dL 24(H) 25(H) 11  Creatinine 0.50 - 1.10 mg/dL 0.96 1.01 0.77  Sodium 135 - 145 mEq/L 138 140 142  Potassium 3.5 - 5.1 mEq/L 4.0 3.9 3.7 DELTA CHECK NOTED  Chloride 96  - 112 mEq/L 98 100 106  CO2 19 - 32 mEq/L 29 30 28   Calcium 8.4 - 10.5 mg/dL 9.4 10.0 8.7  Total Protein 6.0 - 8.3 g/dL 7.1 7.9 -  Total Bilirubin 0.3 - 1.2 mg/dL 0.5 0.4 -  Alkaline Phos 39 - 117 U/L 61 46 -  AST 0 - 37 U/L 30 25 -  ALT 0 - 35 U/L 18 14 -    IMPRESSION:    ICD-10-CM   1. Palpitations  R00.2   2. Dyspnea on exertion  R06.00   3. LBBB (left bundle branch block)  I44.7   4. Former smoker  Z87.891   5. Addison's disease ([place])  E27.1      RECOMMENDATIONS: ALYSIAH SUPPA is a 86 y.o. female whose past medical history and cardiovascular risk factors include: Mild to moderate mitral regurgitation, left bundle branch block, Addison's disease, osteoarthritis, osteoporosis, postmenopausal female, advanced age.  Patient symptoms of palpitation have improved since last visit.  She underwent an extended Holter monitor and the results were discussed in great detail with both the patient and her daughter [name] at today's office visit.  On the extended Holter monitor she did not have any high-grade AV block, sinus pause greater than 3 seconds, or atrial fibrillation during the monitoring period.  She had rare SVE, VE, and 4 asymptomatic episodes of PSVT.  Since she is already feeling tired, fatigue, malaise adding AV nodal blocking agents may further perpetuate her symptoms at this time.  Patient states that she has an upcoming appointment with her endocrinologist to see if she is on the right doses of Florinef and Cortef given her underlying adrenal insufficiency.  Patient states that she will also make an effort to have 3 balanced meals and keeping herself well-hydrated.  At the current time the shared decision was to proceed with current medical management without ordering additional cardiovascular testing at this time.  Did review the recent echo and stress test results from 2019 with the patient daughter at today's office visit.  If she continues to have symptoms that increase  in intensity, frequency, duration or has typical chest pain patient asked to call us for reevaluation or go to the closest ER via EMS.  We also discussed redoing an ischemic evaluation; however, this shared decision was to reevaluate in 1 month.  Left bundle branch block: Continue to monitor.  Addison's disease: Medications reconciled. Chronic and stable  FINAL MEDICATION LIST END OF ENCOUNTER: No orders of the defined types were placed in this encounter.   There are no discontinued medications.   Current Outpatient Medications:    CALCIUM PO, Take 1 tablet by mouth daily., Disp: , Rfl:    fludrocortisone (FLORINEF) 0.1 MG tablet, Take 0.05 mg by mouth daily., Disp: , Rfl:    hydrocortisone (CORTEF) 10 MG tablet, Take 5-15 mg by mouth 2 (two) times daily. Take 1 & 1/2 tabs between 6-8 am & 1/2 tab  between 3-5 pm (double dose on sick days), Disp: , Rfl:    levothyroxine (SYNTHROID, LEVOTHROID) 88 MCG tablet, Take 88 mcg by mouth daily., Disp: , Rfl:    omega-3 acid ethyl esters (LOVAZA) 1 G capsule, Take 1 g by mouth daily. , Disp: , Rfl:    ondansetron (ZOFRAN ODT) 4 MG disintegrating tablet, Take 1 tablet (4 mg total) by mouth every 8 (eight) hours as needed for nausea., Disp: 20 tablet, Rfl: 0  No orders of the defined types were placed in this encounter.  --Continue cardiac medications as reconciled in final medication list. --Return in about 4 weeks (around 08/22/2020) for Reevaluation of palpitations and dyspnea . Or sooner if needed. --Continue follow-up with your primary care physician regarding the management of your other chronic comorbid conditions.  Patient's questions and concerns were addressed to her satisfaction. She voices understanding of the instructions provided during this encounter.   This note was created using a voice recognition software as a result there may be grammatical errors inadvertently enclosed that do not reflect the nature of this encounter. Every  attempt is made to correct such errors.  Total time spent: 33 minutes.   Rex Kras, [place], [facility]  Pager: [phone] Office: [phone]

## 2020-08-23 ENCOUNTER — Ambulatory Visit: Payer: Medicare Other | Admitting: Cardiology

## 2020-12-06 ENCOUNTER — Encounter: Payer: Self-pay | Admitting: Gastroenterology

## 2020-12-20 ENCOUNTER — Ambulatory Visit: Payer: Medicare Other | Admitting: Gastroenterology

## 2020-12-20 ENCOUNTER — Encounter: Payer: Self-pay | Admitting: Gastroenterology

## 2020-12-20 ENCOUNTER — Other Ambulatory Visit: Payer: Medicare Other

## 2020-12-20 VITALS — BP 128/70 | HR 70

## 2020-12-20 DIAGNOSIS — R140 Abdominal distension (gaseous): Secondary | ICD-10-CM | POA: Diagnosis not present

## 2020-12-20 DIAGNOSIS — R1030 Lower abdominal pain, unspecified: Secondary | ICD-10-CM

## 2020-12-20 DIAGNOSIS — R748 Abnormal levels of other serum enzymes: Secondary | ICD-10-CM | POA: Diagnosis not present

## 2020-12-20 DIAGNOSIS — R194 Change in bowel habit: Secondary | ICD-10-CM

## 2020-12-20 DIAGNOSIS — R7989 Other specified abnormal findings of blood chemistry: Secondary | ICD-10-CM | POA: Insufficient documentation

## 2020-12-20 NOTE — Progress Notes (Signed)
12/20/2020 [name] [id number] Feb 21, 1932   HISTORY OF PRESENT ILLNESS: This is a very pleasant 87 year old female who is new to our office.  She was referred to our office by Roe Coombs, PA-C/Dr. Melford Aase, for evaluation regarding diarrhea.  She is here today with her daughter.  She tells me that prior to Christmas time she was having fairly normal bowel movements and if anything tended to be more on the constipated side.  Since Christmas she has been having issues with alternating bowel habits.  She says that sometimes she is constipated, sometimes has diarrhea, sometimes it is loose, sometimes it sticky.  She has seen very small flecks of bright red blood on rare occasion.  She reports intermittent fleeting lower abdominal pain/spasms.  She says that she lost a lot of weight during the pandemic, but that has plateaued and she has gained a little bit back again.  She denies any nausea or vomiting.  Also, on recent labs she was found to have elevated LFTs, primarily alk phos at 516.  AST is elevated at 68 and ALT elevated at 53.  Total bili is normal.  The last ones for comparison were from  06/2019 that were normal at that time.   Past Medical History:  Diagnosis Date  . Addison's disease ([place])   . Hypothyroidism   . Osteoporosis    Past Surgical History:  Procedure Laterality Date  . ganglion cyst foot      reports that she quit smoking about 41 years ago. Her smoking use included cigarettes. She has a 3.50 pack-year smoking history. She has never used smokeless tobacco. She reports current alcohol use of about 1.0 standard drink of alcohol per week. She reports that she does not use drugs. family history includes Angina in her father; Hypertension in her brother; Lung cancer in her father; Ovarian cancer in her sister; Stroke in her mother. No Known Allergies    Outpatient Encounter Medications as of 12/20/2020  Medication Sig  . CALCIUM PO Take 1 tablet by mouth daily.  .  fludrocortisone (FLORINEF) 0.1 MG tablet Take 0.05 mg by mouth daily.  . hydrocortisone (CORTEF) 10 MG tablet Take 5-15 mg by mouth 2 (two) times daily. Take 1 & 1/2 tabs between 6-8 am & 1/2 tab between 3-5 pm (double dose on sick days)  . levothyroxine (SYNTHROID, LEVOTHROID) 88 MCG tablet Take 88 mcg by mouth daily.  . ondansetron (ZOFRAN ODT) 4 MG disintegrating tablet Take 1 tablet (4 mg total) by mouth every 8 (eight) hours as needed for nausea.  . [DISCONTINUED] omega-3 acid ethyl esters (LOVAZA) 1 G capsule Take 1 g by mouth daily.    No facility-administered encounter medications on file as of 12/20/2020.    REVIEW OF SYSTEMS  : All other systems reviewed and negative except where noted in the History of Present Illness.   PHYSICAL EXAM: BP 128/70   Pulse 70  General: Well developed white female in no acute distress Head: Normocephalic and atraumatic Eyes:  Sclerae anicteric, conjunctiva pink. Ears: Normal auditory acuity Lungs: Clear throughout to auscultation; no W/R/R. Heart: Regular rate and rhythm; no M/R/G. Abdomen: Soft, non-distended.  BS present.  Non-tender. Musculoskeletal: Symmetrical with no gross deformities  Skin: No lesions on visible extremities Extremities: No edema  Neurological: Alert oriented x 4, grossly non-focal Psychological:  Alert and cooperative. Normal mood and affect  ASSESSMENT AND PLAN: *Elevated LFTs: Primarily elevated alk phos at 516 and mildly elevated AST 68 and  ALT of 53.  Total bili normal.  Only other labs for comparison more from September 2020 at which time LFTs were normal.  Will start by checking ANA, AMA, anti-smooth muscle antibody, and IgG. *Change in bowel habits with alternating loose stools, diarrhea, constipation: She has declined colonoscopy due to her age, which is certainly reasonable.  She is going to try Benefiber 2 teaspoons mixed in 8 ounces of liquid daily and increase to twice daily if needed. *Generalized abdominal  bloating:  Intermittent.  Also has intermittent lower abdominal pains that are very transient.  **In light of all of the above issues I am going to order a CT scan of the abdomen and pelvis with contrast.   CC:  Chesley Noon, MD

## 2020-12-20 NOTE — Patient Instructions (Signed)
If you are age 85 or older, your body mass index should be between 23-30. Your There is no height or weight on file to calculate BMI. If this is out of the aforementioned range listed, please consider follow up with your Primary Care Provider.  If you are age 75 or younger, your body mass index should be between 19-25. Your There is no height or weight on file to calculate BMI. If this is out of the aformentioned range listed, please consider follow up with your Primary Care Provider.   Your provider has requested that you go to the basement level for lab work before leaving today. Press "B" on the elevator. The lab is located at the first door on the left as you exit the elevator.  You have been scheduled for a CT scan of the abdomen and pelvis at [facility] ([address]---this is in the same building as [employer]).   You are scheduled on _____ at ____. You should arrive 15 minutes prior to your appointment time for registration. Please follow the written instructions below on the day of your exam:  WARNING: IF YOU ARE ALLERGIC TO IODINE/X-RAY DYE, PLEASE NOTIFY RADIOLOGY IMMEDIATELY AT [phone]! YOU WILL BE GIVEN A 13 HOUR PREMEDICATION PREP.  1) Do not eat or drink anything after _____ (4 hours prior to your test) 2) You have been given 2 bottles of oral contrast to drink. The solution may taste better if refrigerated, but do NOT add ice or any other liquid to this solution. Shake well before drinking.    Drink 1 bottle of contrast @ _____ (2 hours prior to your exam)  Drink 1 bottle of contrast @ _____ (1 hour prior to your exam)  You may take any medications as prescribed with a small amount of water, if necessary. If you take any of the following medications: METFORMIN, GLUCOPHAGE, GLUCOVANCE, AVANDAMET, RIOMET, FORTAMET, [place] MET, JANUMET, GLUMETZA or METAGLIP, you MAY be asked to HOLD this medication 48 hours AFTER the exam.  The purpose of you  drinking the oral contrast is to aid in the visualization of your intestinal tract. The contrast solution may cause some diarrhea. Depending on your individual set of symptoms, you may also receive an intravenous injection of x-ray contrast/dye. Plan on being at [facility] for 30 minutes or longer, depending on the type of exam you are having performed.  This test typically takes 30-45 minutes to complete.  If you have any questions regarding your exam or if you need to reschedule, you may call the CT department at [phone] between the hours of 8:00 am and 5:00 pm, Monday-Friday.  ______________________________________________________________

## 2020-12-21 ENCOUNTER — Ambulatory Visit (INDEPENDENT_AMBULATORY_CARE_PROVIDER_SITE_OTHER)
Admission: RE | Admit: 2020-12-21 | Discharge: 2020-12-21 | Disposition: A | Discharge status: Home / Self Care | Payer: Medicare Other | Source: Ambulatory Visit | Attending: Gastroenterology | Admitting: Gastroenterology

## 2020-12-21 ENCOUNTER — Telehealth: Payer: Self-pay | Admitting: Gastroenterology

## 2020-12-21 ENCOUNTER — Other Ambulatory Visit: Payer: Self-pay

## 2020-12-21 DIAGNOSIS — R140 Abdominal distension (gaseous): Secondary | ICD-10-CM

## 2020-12-21 DIAGNOSIS — R7401 Elevation of levels of liver transaminase levels: Secondary | ICD-10-CM | POA: Diagnosis not present

## 2020-12-21 DIAGNOSIS — R194 Change in bowel habit: Secondary | ICD-10-CM | POA: Diagnosis not present

## 2020-12-21 DIAGNOSIS — R1030 Lower abdominal pain, unspecified: Secondary | ICD-10-CM

## 2020-12-21 DIAGNOSIS — C229 Malignant neoplasm of liver, not specified as primary or secondary: Secondary | ICD-10-CM

## 2020-12-21 DIAGNOSIS — C787 Secondary malignant neoplasm of liver and intrahepatic bile duct: Secondary | ICD-10-CM

## 2020-12-21 MED ORDER — IOHEXOL 300 MG/ML  SOLN
100.0000 mL | Freq: Once | INTRAMUSCULAR | Status: AC | PRN
  Administered 2020-12-21: 100 mL via INTRAVENOUS

## 2020-12-21 NOTE — Progress Notes (Signed)
I agree with the above note, plan 

## 2020-12-21 NOTE — Telephone Encounter (Signed)
[name] the pt would like a call to discuss some more questions she has bout the CT results

## 2020-12-21 NOTE — Telephone Encounter (Signed)
Received a call report from CT.  Please see report.

## 2020-12-21 NOTE — Telephone Encounter (Signed)
Noted  

## 2020-12-21 NOTE — Telephone Encounter (Signed)
Question answered.  All she wanted to know is if the mass was within the liver or outside of the liver.

## 2020-12-22 LAB — MITOCHONDRIAL ANTIBODIES: Mitochondrial M2 Ab, IgG: <=20 U

## 2020-12-23 LAB — AFP TUMOR MARKER: AFP-Tumor Marker: 20 ng/mL — ABNORMAL HIGH

## 2020-12-23 LAB — ANA: Anti Nuclear Antibody (ANA): NEGATIVE

## 2020-12-23 LAB — TEST AUTHORIZATION

## 2020-12-23 LAB — HEPATITIS C ANTIBODY
Hepatitis C Ab: NONREACTIVE
SIGNAL TO CUT-OFF: 0.01

## 2020-12-23 LAB — ANTI-SMOOTH MUSCLE ANTIBODY, IGG: Actin (Smooth Muscle) Antibody (IGG): <20 U (ref <20)

## 2020-12-23 LAB — HEPATITIS B SURFACE ANTIBODY,QUALITATIVE: Hep B S Ab: NONREACTIVE

## 2020-12-23 LAB — IGG: IgG (Immunoglobin G), Serum: 941 mg/dL (ref 600–1540)

## 2020-12-23 LAB — HEPATITIS B SURFACE ANTIGEN: Hepatitis B Surface Ag: NONREACTIVE

## 2020-12-23 LAB — HEPATITIS A ANTIBODY, TOTAL: Hepatitis A AB,Total: NONREACTIVE

## 2020-12-26 ENCOUNTER — Telehealth: Payer: Self-pay | Admitting: Gastroenterology

## 2020-12-26 NOTE — Telephone Encounter (Signed)
See alternate results note dated 3/15

## 2020-12-26 NOTE — Telephone Encounter (Signed)
Pt is returning a missed call regarding her labs

## 2020-12-27 NOTE — Telephone Encounter (Signed)
Patient is calling to follow up on previous message please go over lab results.

## 2020-12-30 ENCOUNTER — Other Ambulatory Visit: Payer: Self-pay

## 2020-12-30 ENCOUNTER — Ambulatory Visit (HOSPITAL_COMMUNITY)
Admission: RE | Admit: 2020-12-30 | Discharge: 2020-12-30 | Disposition: A | Discharge status: Home / Self Care | Payer: Medicare Other | Source: Ambulatory Visit | Attending: Gastroenterology | Admitting: Gastroenterology

## 2020-12-30 DIAGNOSIS — C787 Secondary malignant neoplasm of liver and intrahepatic bile duct: Secondary | ICD-10-CM | POA: Diagnosis present

## 2020-12-30 MED ORDER — GADOBUTROL 1 MMOL/ML IV SOLN
5.0000 mL | Freq: Once | INTRAVENOUS | Status: AC | PRN
  Administered 2020-12-30: 5 mL via INTRAVENOUS

## 2021-01-01 ENCOUNTER — Other Ambulatory Visit: Payer: Self-pay

## 2021-01-01 ENCOUNTER — Telehealth: Payer: Self-pay | Admitting: Gastroenterology

## 2021-01-01 DIAGNOSIS — C229 Malignant neoplasm of liver, not specified as primary or secondary: Secondary | ICD-10-CM

## 2021-01-01 NOTE — Telephone Encounter (Signed)
See results note. 

## 2021-01-01 NOTE — Telephone Encounter (Signed)
Inbound call from patient requesting a call back please with MRI results.

## 2021-01-01 NOTE — Telephone Encounter (Signed)
Dr Ardis Hughs the pt is calling for MRI results; have you reviewed?

## 2021-01-03 ENCOUNTER — Encounter (HOSPITAL_COMMUNITY): Payer: Self-pay

## 2021-01-03 ENCOUNTER — Telehealth: Payer: Self-pay | Admitting: Gastroenterology

## 2021-01-03 ENCOUNTER — Other Ambulatory Visit: Payer: Self-pay | Admitting: Gastroenterology

## 2021-01-03 DIAGNOSIS — C229 Malignant neoplasm of liver, not specified as primary or secondary: Secondary | ICD-10-CM

## 2021-01-03 NOTE — Progress Notes (Signed)
DF    1       [name] Female, 87 y.o., December 20, 1931  MRN:  [id number] Phone:  [phone] (H)       PCP:  Chesley Noon, MD Coverage:  [employer]            RE: Biopsy Received: Yesterday  Message Details  [name], MD  [name]   [place] core liver lesion  May need [place] para 1st (same day) if effusion persists   DDH    Previous Messages  ----- Message -----  From: [name]  Sent: 01/02/2021  3:49 PM EDT  To: [facility] Procedure Requests  Subject: Biopsy                      Procedure Requested: US Biopsy    Reason for Procedure: large liver tumor    Provider Requesting: [name]  Provider Telephone: [phone]    Other Info:

## 2021-01-03 NOTE — Telephone Encounter (Signed)
Spoke with the pt and advised her that she will be called from schedulers to set up her biopsy.

## 2021-01-03 NOTE — Telephone Encounter (Signed)
Pt called stating that she needs to schedule a liver biopsy. Pls call her.

## 2021-01-04 ENCOUNTER — Telehealth: Payer: Self-pay | Admitting: Oncology

## 2021-01-04 NOTE — Telephone Encounter (Signed)
Received a new pt referral from Dr. Ardis Hughs for malignant neoplasm of liver, unspecified liver malignancy type. Ms. Noguera has been scheduled to see Dr. Benay Spice at the [facility] location on 4/13 at 1040am. Appt date and time has been given to the pt's daughter. Aware to arrive 20 minutes early.

## 2021-01-08 ENCOUNTER — Other Ambulatory Visit: Payer: Self-pay | Admitting: Student

## 2021-01-08 ENCOUNTER — Telehealth: Payer: Self-pay | Admitting: *Deleted

## 2021-01-08 NOTE — Telephone Encounter (Signed)
Daughter left message asking if patient could be seen sooner than 01/24/21 since her biopsy is being done tomorrow? Forwarded request to [facility].

## 2021-01-09 ENCOUNTER — Other Ambulatory Visit: Payer: Self-pay

## 2021-01-09 ENCOUNTER — Ambulatory Visit (HOSPITAL_COMMUNITY)
Admission: RE | Admit: 2021-01-09 | Discharge: 2021-01-09 | Disposition: A | Discharge status: Home / Self Care | Payer: Medicare Other | Source: Ambulatory Visit | Attending: Gastroenterology | Admitting: Gastroenterology

## 2021-01-09 ENCOUNTER — Other Ambulatory Visit: Payer: Self-pay | Admitting: Gastroenterology

## 2021-01-09 DIAGNOSIS — R188 Other ascites: Secondary | ICD-10-CM | POA: Diagnosis not present

## 2021-01-09 DIAGNOSIS — C229 Malignant neoplasm of liver, not specified as primary or secondary: Secondary | ICD-10-CM | POA: Insufficient documentation

## 2021-01-09 LAB — CBC
HCT: 40.5 % (ref 36.0–46.0)
Hemoglobin: 12.8 g/dL (ref 12.0–15.0)
MCH: 30.6 pg (ref 26.0–34.0)
MCHC: 31.6 g/dL (ref 30.0–36.0)
MCV: 96.9 fL (ref 80.0–100.0)
Platelets: 194 10*3/uL (ref 150–400)
RBC: 4.18 MIL/uL (ref 3.87–5.11)
RDW: 15.1 % (ref 11.5–15.5)
WBC: 8 10*3/uL (ref 4.0–10.5)
nRBC: 0 % (ref 0.0–0.2)

## 2021-01-09 LAB — PROTIME-INR
INR: 0.9 (ref 0.8–1.2)
Prothrombin Time: 12 seconds (ref 11.4–15.2)

## 2021-01-09 MED ORDER — FENTANYL CITRATE (PF) 100 MCG/2ML IJ SOLN
INTRAMUSCULAR | Status: AC
  Filled 2021-01-09: qty 2

## 2021-01-09 MED ORDER — GELATIN ABSORBABLE 12-7 MM EX MISC
CUTANEOUS | Status: AC
  Filled 2021-01-09: qty 1

## 2021-01-09 MED ORDER — HYDROCODONE-ACETAMINOPHEN 5-325 MG PO TABS: 1.0000 | ORAL_TABLET | ORAL | Status: DC | PRN

## 2021-01-09 MED ORDER — FENTANYL CITRATE (PF) 100 MCG/2ML IJ SOLN
INTRAMUSCULAR | Status: AC | PRN
  Administered 2021-01-09 (×2): 25 ug via INTRAVENOUS

## 2021-01-09 MED ORDER — SODIUM CHLORIDE 0.9 % IV SOLN: INTRAVENOUS | Status: DC

## 2021-01-09 MED ORDER — MIDAZOLAM HCL 2 MG/2ML IJ SOLN
INTRAMUSCULAR | Status: AC
  Filled 2021-01-09: qty 2

## 2021-01-09 MED ORDER — MIDAZOLAM HCL 2 MG/2ML IJ SOLN
INTRAMUSCULAR | Status: AC | PRN
  Administered 2021-01-09: 1 mg via INTRAVENOUS
  Administered 2021-01-09: 0.5 mg via INTRAVENOUS

## 2021-01-09 NOTE — Progress Notes (Signed)
Pt ambulated without difficulty or bleeding.   Discharged home with daughter who will drive and stay with pt x 24 hrs 

## 2021-01-09 NOTE — H&P (Signed)
Chief Complaint: Patient was seen in consultation today for liver lesion/biopsy.  Referring Physician(s): Jacobs,Daniel P (GI)  Supervising Physician: Mir, Sharen Heck  Patient Status: [facility] - Out-pt  History of Present Illness: [name] is a 87 y.o. female with a past medical history of hypothyroidism, Addison's disease, and osteoporosis. She was recently referred to GI for management of diarrhea. Further work-up revealed elevated LFTs (AST 68/ALT 53) and alk phos (516). Imaging was ordered for further evaluation which revealed a liver lesion/mass suspicious for malignancy.  MR abdomen 12/30/2020: 1. Large poorly marginated 9.4 x 6.8 cm liver mass centered in the segment 5 right liver lobe with additional involvement of liver segments 4A and 8, new since 02/19/2013 CT abdomen study, compatible with primary liver malignancy. Prominent peripheral intrahepatic biliary ductal dilatation (relatively sparing the left liver lobe). Large expansile occlusive tumor thrombus in the main, left and right portal veins. Mass is characterized as LR-M with a differential of hepatocellular carcinoma or combined hepatocellular carcinoma-cholangiocarcinoma. Percutaneous biopsy is suggested. 2. Solitary 0.9 cm segment 2 left liver lesion with restricted diffusion and enhancement, suspicious for liver metastasis. 3. Mildly enlarged portal caval node, cannot exclude nodal metastasis. 4. Small volume abdominal ascites, predominantly perihepatic. 5. Small renal cortical mass in the lower right kidney with questionable low level heterogeneous enhancement, cannot exclude papillary renal cell carcinoma. Suggest urology consultation and follow-up MRI abdomen without and with IV contrast in 6-12 months if clinically warranted due to the above comorbidities.  CT abdomen/pelvis 12/21/2020: 1. Large poorly marginated hypodense 8.0 cm right liver mass with associated prominent peripheral intrahepatic biliary ductal  dilatation throughout the right liver, new since most recent comparison CT performed in 2014. Primary liver malignancy suspected, such as intrahepatic cholangiocarcinoma. Percutaneous biopsy suggested. MRI abdomen without and with IV contrast could be obtained for further characterization as clinically warranted. 2. Large expansile tumor thrombus occluding the main, left and right portal veins. 3. Mild porta hepatis lymphadenopathy, cannot exclude nodal metastasis. 4. Indeterminate hypodense 2.0 cm lower right renal cortical lesion, increased since 2014 CT. This lesion may be further characterized with MRI or CT abdomen without and with IV contrast, which may be performed as clinically warranted given the above comorbidities. 5. Small volume ascites. 6. Aortic Atherosclerosis (ICD10-I70.0).  IR consulted by Dr. Ardis Hughs for possible liver lesion/mass biopsy. Patient awake and alert laying in bed with no complaints at this time. Denies fever, chills, chest pain, dyspnea, abdominal pain, or headache.   Past Medical History:  Diagnosis Date  . Addison's disease ([place])   . Hypothyroidism   . Osteoporosis     Past Surgical History:  Procedure Laterality Date  . ganglion cyst foot      Allergies: Patient has no known allergies.  Medications: Prior to Admission medications   Medication Sig Start Date End Date Taking? Authorizing Provider  CALCIUM PO Take 650 mg by mouth daily.   Yes [provider]  Cholecalciferol (VITAMIN D) 50 MCG (2000 UT) tablet Take 2,000 Units by mouth daily.   Yes [provider]  fludrocortisone (FLORINEF) 0.1 MG tablet Take 0.05 mg by mouth daily.   Yes [provider]  hydrocortisone (CORTEF) 10 MG tablet Take 5-10 mg by mouth See admin instructions. Take 10 mg at 7 am, 5 mg at noon, 5 mg at 3pm   Yes [provider]  levothyroxine (SYNTHROID, LEVOTHROID) 88 MCG tablet Take 88 mcg by mouth daily.   Yes [provider]   ondansetron (ZOFRAN) 4  MG tablet Take 4 mg by mouth every 8 (eight) hours as needed for nausea or vomiting.   Yes [provider]  Wheat Dextrin (BENEFIBER PO) Take 3 tablets by mouth daily.   Yes [provider]     Family History  Problem Relation Age of Onset  . Lung cancer Father   . Angina Father   . Stroke Mother   . Hypertension Brother   . Ovarian cancer Sister     Social History   Socioeconomic History  . Marital status: Widowed    Spouse name: Not on file  . Number of children: 2  . Years of education: Not on file  . Highest education level: Not on file  Occupational History  . Occupation: Chief Financial Officer    Comment: [employer]  Tobacco Use  . Smoking status: Former Smoker    Packs/day: 0.50    Years: 7.00    Pack years: 3.50    Types: Cigarettes    Quit date: 01/30/1979    Years since quitting: 41.9  . Smokeless tobacco: Never Used  Vaping Use  . Vaping Use: Never used  Substance and Sexual Activity  . Alcohol use: Yes    Alcohol/week: 1.0 standard drink    Types: 1 Glasses of wine per week  . Drug use: Never  . Sexual activity: Not Currently  Other Topics Concern  . Not on file  Social History Narrative  . Not on file   Social Determinants of Health   Financial Resource Strain: Not on file  Food Insecurity: Not on file  Transportation Needs: Not on file  Physical Activity: Not on file  Stress: Not on file  Social Connections: Not on file     Review of Systems: A 12 point ROS discussed and pertinent positives are indicated in the HPI above.  All other systems are negative.  Review of Systems  Vital Signs: BP 139/67   Pulse 68   Temp 98 F (36.7 C)   Ht 5' 7"  (1.702 m)   Wt 133 lb (60.3 kg)   SpO2 98%   BMI 20.83 kg/m   Physical Exam   MD Evaluation Airway: WNL Heart: WNL Abdomen: WNL Chest/ Lungs: WNL ASA  Classification: 3 Mallampati/Airway Score: One   Imaging: MR Abdomen W Wo Contrast  Result Date:  12/31/2020 CLINICAL DATA:  Large right liver mass on recent CT performed for elevated liver function tests. EXAM: MRI ABDOMEN WITHOUT AND WITH CONTRAST TECHNIQUE: Multiplanar multisequence MR imaging of the abdomen was performed both before and after the administration of intravenous contrast. CONTRAST:  51m GADAVIST GADOBUTROL 1 MMOL/ML IV SOLN COMPARISON:  12/21/2020 CT abdomen/pelvis. FINDINGS: Lower chest: No acute abnormality at the lung bases. Hepatobiliary: Normal liver size. No hepatic steatosis. There is a large 9.4 x 6.8 cm poorly marginated liver mass centered in the segment 5 right liver lobe (series 13/image 30) with additional involvement of liver segments 4A and 8, new since 02/19/2013 CT abdomen study, demonstrating heterogeneous mild arterial hyperenhancement and patchy regions of portal phase washout, with associated prominent peripheral intrahepatic biliary ductal dilatation (relatively sparing the left liver lobe) and with contiguous large expansile occlusive tumor thrombus in the main, left and right portal veins (series 21/image 37), compatible with primary liver malignancy. There is a slightly T2 hyperintense 0.9 cm segment 2 left liver lesion (series 4/image 11) with restricted diffusion and enhancement, not definitely seen on 02/19/2013 CT abdomen study, suspicious for metastasis. Normal gallbladder with no cholelithiasis. No biliary  ductal dilatation. Common bile duct diameter 3 mm. No choledocholithiasis. No biliary beading. Pancreas: No pancreatic mass or duct dilation.  No pancreas divisum. Spleen: Normal size. No mass. Adrenals/Urinary Tract: No discrete adrenal nodules. No hydronephrosis. There is a 1.9 x 1.5 cm renal cortical mass in the lower right kidney (series 32/image 75) with T1 and T2 near isointensity and questionable low level heterogeneous enhancement on the postcontrast subtraction images, cannot exclude papillary renal cell carcinoma. Two subcentimeter T1 hyperintense  renal cortical lesions in the kidneys bilaterally demonstrate no convincing enhancement, compatible with Bosniak category 2 hemorrhagic/proteinaceous renal cysts. Several small simple renal cortical cysts in both kidneys, largest 1.9 cm posteriorly in the interpolar left kidney. Stomach/Bowel: Normal non-distended stomach. Visualized small and large bowel is normal caliber, with no bowel wall thickening. Vascular/Lymphatic: Atherosclerotic nonaneurysmal abdominal aorta. Patent hepatic, renal and splenic veins. No evidence of renal vein tumor thrombus. Mildly enlarged 1.5 cm portal caval node (series 21/image 44). Other: Small volume abdominal ascites, predominantly perihepatic. No focal fluid collection. Musculoskeletal: No aggressive appearing focal osseous lesions. IMPRESSION: 1. Large poorly marginated 9.4 x 6.8 cm liver mass centered in the segment 5 right liver lobe with additional involvement of liver segments 4A and 8, new since 02/19/2013 CT abdomen study, compatible with primary liver malignancy. Prominent peripheral intrahepatic biliary ductal dilatation (relatively sparing the left liver lobe). Large expansile occlusive tumor thrombus in the main, left and right portal veins. Mass is characterized as LR-M with a differential of hepatocellular carcinoma or combined hepatocellular carcinoma-cholangiocarcinoma. Percutaneous biopsy is suggested. 2. Solitary 0.9 cm segment 2 left liver lesion with restricted diffusion and enhancement, suspicious for liver metastasis. 3. Mildly enlarged portal caval node, cannot exclude nodal metastasis. 4. Small volume abdominal ascites, predominantly perihepatic. 5. Small renal cortical mass in the lower right kidney with questionable low level heterogeneous enhancement, cannot exclude papillary renal cell carcinoma. Suggest urology consultation and follow-up MRI abdomen without and with IV contrast in 6-12 months if clinically warranted due to the above comorbidities.  Electronically Signed   By: Ilona Sorrel M.D.   On: 12/31/2020 19:28   CT Abdomen Pelvis W Contrast  Result Date: 12/21/2020 CLINICAL DATA:  Lower abdominal pain, change in bowel habits, abdominal distension for 3 months. Elevated liver function tests. EXAM: CT ABDOMEN AND PELVIS WITH CONTRAST TECHNIQUE: Multidetector CT imaging of the abdomen and pelvis was performed using the standard protocol following bolus administration of intravenous contrast. CONTRAST:  136m OMNIPAQUE IOHEXOL 300 MG/ML  SOLN COMPARISON:  02/19/2013 CT abdomen. FINDINGS: Lower chest: No significant pulmonary nodules or acute consolidative airspace disease. Hepatobiliary: There is a large poorly marginated hypodense 8.0 x 6.4 cm right liver mass predominantly involving segments 5 and 8 (series 2/image 25), new since 02/19/2013 CT. No additional liver masses. There is prominent peripheral intrahepatic biliary ductal dilatation throughout the right liver. Normal caliber common bile duct (5 mm diameter). Normal gallbladder with no radiopaque cholelithiasis. Pancreas: Normal, with no mass or duct dilation. Spleen: Normal size. No mass. Adrenals/Urinary Tract: No discrete adrenal nodules. Hypodense 2.0 cm lower right renal cortical lesion (series 2/image 45), increased from 1.1 cm on 02/19/2013 CT. Several simple bilateral renal cysts, largest 1.8 cm in the posterior upper left kidney. Several hypodense subcentimeter bilateral renal lesions, too small to characterize. No overt hydronephrosis. Normal bladder. Stomach/Bowel: Normal non-distended stomach. Normal caliber small bowel with no small bowel wall thickening. Normal appendix. Oral contrast transits to the left colon. Normal large bowel with no diverticulosis, large bowel  wall thickening or pericolonic fat stranding. Vascular/Lymphatic: Atherosclerotic nonaneurysmal abdominal aorta. Large expansile tumor thrombus occluding the main, left and right portal veins, measuring up to 2.7 cm  diameter in the main portal vein (series 2/image 23). Patent hepatic, renal and splenic veins. Mildly enlarged 1.0 cm porta hepatis node (series 2/image 24). No additional pathologically enlarged abdominopelvic nodes. Reproductive: Atrophic uterus.  No adnexal masses. Other: No pneumoperitoneum. Small volume ascites. No focal fluid collections. Musculoskeletal: No aggressive appearing focal osseous lesions. Mild thoracolumbar spondylosis. IMPRESSION: 1. Large poorly marginated hypodense 8.0 cm right liver mass with associated prominent peripheral intrahepatic biliary ductal dilatation throughout the right liver, new since most recent comparison CT performed in 2014. Primary liver malignancy suspected, such as intrahepatic cholangiocarcinoma. Percutaneous biopsy suggested. MRI abdomen without and with IV contrast could be obtained for further characterization as clinically warranted. 2. Large expansile tumor thrombus occluding the main, left and right portal veins. 3. Mild porta hepatis lymphadenopathy, cannot exclude nodal metastasis. 4. Indeterminate hypodense 2.0 cm lower right renal cortical lesion, increased since 2014 CT. This lesion may be further characterized with MRI or CT abdomen without and with IV contrast, which may be performed as clinically warranted given the above comorbidities. 5. Small volume ascites. 6. Aortic Atherosclerosis (ICD10-I70.0). These results will be called to the ordering clinician or representative by the Radiologist Assistant, and communication documented in the PACS or [employer]. Electronically Signed   By: Ilona Sorrel M.D.   On: 12/21/2020 10:55    Labs:  CBC: No results for input(s): WBC, HGB, HCT, PLT in the last 8760 hours.  COAGS: No results for input(s): INR, APTT in the last 8760 hours.  BMP: No results for input(s): NA, K, CL, CO2, GLUCOSE, BUN, CALCIUM, CREATININE, GFRNONAA, GFRAA in the last 8760 hours.  Invalid input(s): CMP  LIVER FUNCTION  TESTS: No results for input(s): BILITOT, AST, ALT, ALKPHOS, PROT, ALBUMIN in the last 8760 hours.  TUMOR MARKERS: Recent Labs    12/20/20 1203  AFPTM 20.0*    Assessment and Plan:  Liver lesion/mass (segment 5 right liver lobe with additional involvement of liver segments 4A and 8) concerning for metastatic process. Plan for image-guided liver lesion biopsy today in IR. Patient is NPO. Afebrile. INR pending. US abdomen limited obtained prior to procedure to R/O ascites (possible paracentesis prior to procedure).  Risks and benefits discussed with the patient including, but not limited to bleeding, infection, damage to adjacent structures or low yield requiring additional tests. All of the patient's questions were answered, patient is agreeable to proceed. Consent signed and in chart.   Thank you for this interesting consult.  I greatly enjoyed meeting [name] and look forward to participating in their care.  A copy of this report was sent to the requesting provider on this date.  Electronically Signed: Earley Abide, PA-C 01/09/2021, 11:44 AM   I spent a total of 30 Minutes in face to face in clinical consultation, greater than 50% of which was counseling/coordinating care for liver lesion/biopsy.

## 2021-01-09 NOTE — Procedures (Signed)
Interventional Radiology Procedure Note  Procedure: Liver mass biopsy  Indication: Right liver mass  Findings: Please refer to procedural dictation for full description.  Complications: None  EBL: < 10 mL  Miachel Roux, MD [phone]

## 2021-01-09 NOTE — Discharge Instructions (Signed)
Liver Biopsy, Care After These instructions give you information on caring for yourself after your procedure. Your doctor may also give you more specific instructions. Call your doctor if you have any problems or questions after your procedure. What can I expect after the procedure? After the procedure, it is common to have:  Pain and soreness where the biopsy was done.  Bruising around the area where the biopsy was done.  Sleepiness and be tired for a few days. Follow these instructions at home: Medicines  Take over-the-counter and prescription medicines only as told by your doctor.  If you were prescribed an antibiotic medicine, take it as told by your doctor. Do not stop taking the antibiotic even if you start to feel better.  Do not take medicines such as aspirin and ibuprofen. These medicines can thin your blood. Do not take these medicines unless your doctor tells you to take them.  If you are taking prescription pain medicine, take actions to prevent or treat constipation. Your doctor may recommend that you: ? Drink enough fluid to keep your pee (urine) clear or pale yellow. ? Take over-the-counter or prescription medicines. ? Eat foods that are high in fiber, such as fresh fruits and vegetables, whole grains, and beans. ? Limit foods that are high in fat and processed sugars, such as fried and sweet foods. Caring for your cut  Follow instructions from your doctor about how to take care of your cuts from surgery (incisions). Make sure you: ? Wash your hands with soap and water before you change your bandage (dressing). If you cannot use soap and water, use hand sanitizer. ? Change your bandage as told by your doctor. ? Leave stitches (sutures), skin glue, or skin tape (adhesive) strips in place. They may need to stay in place for 2 weeks or longer. If tape strips get loose and curl up, you may trim the loose edges. Do not remove tape strips completely unless your doctor says it is  okay.  Check your cuts every day for signs of infection. Check for: ? Redness, swelling, or more pain. ? Fluid or blood. ? Pus or a bad smell. ? Warmth.  Do not take baths, swim, or use a hot tub until your doctor says it is okay to do so. Activity  Rest at home for 1-2 days or as told by your doctor. ? Avoid sitting for a long time without moving. Get up to take short walks every 1-2 hours.  Return to your normal activities as told by your doctor. Ask what activities are safe for you.  Do not do these things in the first 24 hours: ? Drive. ? Use machinery. ? Take a bath or shower.  Do not lift more than 10 pounds (4.5 kg) or play contact sports for the first 2 weeks.   General instructions  Do not drink alcohol in the first week after the procedure.  Have someone stay with you for at least 24 hours after the procedure.  Get your test results. Ask your doctor or the department that is doing the test: ? When will my results be ready? ? How will I get my results? ? What are my treatment options? ? What other tests do I need? ? What are my next steps?  Keep all follow-up visits as told by your doctor. This is important.   Contact a doctor if:  A cut bleeds and leaves more than just a small spot of blood.  A cut is red,   puffs up (swells), or hurts more than before.  Fluid or something else comes from a cut.  A cut smells bad.  You have a fever or chills. Get help right away if:  You have swelling, bloating, or pain in your belly (abdomen).  You get dizzy or faint.  You have a rash.  You feel sick to your stomach (nauseous) or throw up (vomit).  You have trouble breathing, feel short of breath, or feel faint.  Your chest hurts.  You have problems talking or seeing.  You have trouble with your balance or moving your arms or legs. Summary  After the procedure, it is common to have pain, soreness, bruising, and tiredness.  Your doctor will tell you how to  take care of yourself at home. Change your bandage, take your medicines, and limit your activities as told by your doctor.  Call your doctor if you have symptoms of infection. Get help right away if your belly swells, your cut bleeds a lot, or you have trouble talking or breathing. This information is not intended to replace advice given to you by your health care provider. Make sure you discuss any questions you have with your health care provider. Document Revised: 10/09/2017 Document Reviewed: 10/10/2017 Elsevier Patient Education  2021 [employer]  

## 2021-01-09 NOTE — Sedation Documentation (Signed)
bx site assessed. Clean, dry and intact. No hematoma noted. Soft to palpation. No drainage from site

## 2021-01-24 ENCOUNTER — Other Ambulatory Visit: Payer: Self-pay

## 2021-01-24 ENCOUNTER — Inpatient Hospital Stay: Payer: Medicare Other | Attending: Oncology | Admitting: Oncology

## 2021-01-24 VITALS — BP 129/56 | HR 73 | Temp 97.8°F | Resp 18 | Ht 67.0 in | Wt 133.6 lb

## 2021-01-24 DIAGNOSIS — R630 Anorexia: Secondary | ICD-10-CM | POA: Diagnosis not present

## 2021-01-24 DIAGNOSIS — K5900 Constipation, unspecified: Secondary | ICD-10-CM

## 2021-01-24 DIAGNOSIS — R160 Hepatomegaly, not elsewhere classified: Secondary | ICD-10-CM

## 2021-01-24 DIAGNOSIS — K625 Hemorrhage of anus and rectum: Secondary | ICD-10-CM

## 2021-01-24 DIAGNOSIS — Z801 Family history of malignant neoplasm of trachea, bronchus and lung: Secondary | ICD-10-CM

## 2021-01-24 DIAGNOSIS — Z8041 Family history of malignant neoplasm of ovary: Secondary | ICD-10-CM

## 2021-01-24 DIAGNOSIS — R197 Diarrhea, unspecified: Secondary | ICD-10-CM | POA: Diagnosis not present

## 2021-01-24 NOTE — Progress Notes (Signed)
[facility] New Patient Consult   Requesting MD: Chesley Noon, [address],  [place] [address]   [name] 87 y.o.  13-Dec-1931    Reason for Consult: Liver mass   HPI: [name] reports developing diarrhea beginning on Christmas day.  She continues to have intermittent diarrhea and constipation.  She saw her primary provider and was noted to have elevated liver enzymes.  She was referred to gastroenterology. A CT of the abdomen and pelvis on 12/21/2020 revealed a large poorly marginated right liver mass predominantly involving segments 5 and 8.  No additional liver masses.  Peripheral Intermatic biliary ductal dilatation throughout the right liver.  A hypodense mass in the right kidney measured 2 cm, increased from 1.1 cm on 02/19/2013.  A large expansile tumor thrombus occludes the main, left and right portal veins.  Mildly enlarged porta hepatis node.  Small volume ascites.  MRI of the liver on 12/30/2020 revealed a 9.4 x 6.8 cm mass centered in segment 5 of the right liver with additional involvement of segments 4A and 8.  Prominent peripheral intrahepatic biliary duct dilatation and occlusive tumor thrombus in the main, left, and right portal veins.  0.9 cm segment 2 left liver lesion suspicious for a metastasis.  1.9 x 1.5 cm lower right renal mass..  1.5 cm portacaval node.  Small volume ascites. The AFP returned mildly elevated.  She was referred for an ultrasound-guided biopsy of the liver lesion on 01/09/2021.  A prebiopsy paracentesis was performed with 300 cc of fluid removed.  The pathology has been referred out for consultation.   Past Medical History:  Diagnosis Date  . Addison's disease ([place])  age 27  . Hypothyroidism   . Osteoporosis     .  G2, P2  Past Surgical History:  Procedure Laterality Date  . ganglion cyst foot      Medications: Reviewed  Allergies: No Known Allergies  Family history: Her sister had ovarian  cancer in her 76s.  Her father had lung cancer and was a smoker.  Social History:   She lives with her son in [place].  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  She quit smoking cigarettes in 1980.  She drinks wine 4 days/week.  She has received the COVID-19 vaccine.  No risk factor for HIV or hepatitis.  ROS:   Positives include: Anorexia, 5 pound weight loss, intermittent diarrhea, small amounts of bright red blood per rectum a few days per week  A complete ROS was otherwise negative.  Physical Exam:  Blood pressure (!) 129/56, pulse 73, temperature 97.8 F (36.6 C), temperature source Tympanic, resp. rate 18, height 5\' 7"  (1.702 m), weight 133 lb 9.6 oz (60.6 kg), SpO2 97 %.  HEENT: Neck without mass Lungs: Clear bilaterally Cardiac: Regular rate and rhythm Abdomen: No splenomegaly, mildly distended, nontender, fullness in the right subcostal region without a discrete liver edge  Vascular: No leg edema Lymph nodes: No cervical, supraclavicular, axillary, or inguinal nodes Neurologic: Alert and oriented, the motor exam appears intact in the upper and lower extremities bilaterally Skin: No rash Musculoskeletal: No spine tenderness   LAB:  CBC  Lab Results  Component Value Date   WBC 8.0 01/09/2021   HGB 12.8 01/09/2021   HCT 40.5 01/09/2021   MCV 96.9 01/09/2021   PLT 194 01/09/2021   NEUTROABS 5.8 11/03/2012    AFP on 12/20/2020: 20    CMP  12/11/2020: [facility]-bilirubin 0.7, alkaline phosphatase 516, AST 56, ALT 39, creatinine 0.82  Imaging:  As per HPI-CT images from 12/21/2020 reviewed with [name] and her granddaughter   Assessment/Plan:   1. 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  CT abdomen/pelvis 12/21/2020-large right liver mass with peripheral intrahepatic biliary duct dilatation in the right liver, expansile tumor thrombus occluding the main, left, and right portal veins, mild porta hepatis lymphadenopathy,  indeterminate 2 cm hypodense right renal lesion, small volume ascites  MRI abdomen 12/30/2020-9.4 x 6.8 cm liver mass centered in segment 5 with involvement of segments 4A and 8, peripheral intrahepatic biliary ductal dilatation, expansile occlusive tumor thrombus in the main, left, right portal veins, mass characterized as LR-M, solitary 0.9 cm segment 2 lesion suspicious for metastasis, mildly enlarged portacaval node, small volume ascites, right renal cortical mass-renal cell carcinoma not excluded  Ultrasound-guided biopsy of right liver mass on 01/09/2021-pathology pending  2. Addison's disease 3. Anorexia/weight loss secondary to #1 4. Family history of ovarian cancer 5. Hypothyroidism 6. Intermittent diarrhea-related to the right liver mass? 7. Report of intermittent small-volume rectal bleeding   Disposition:   [name] presents with a large right liver mass with associated portal vein tumor thrombus.  She is symptomatic with anorexia and weight loss.  It is unclear whether her intermittent diarrhea is related to the mass.  The final pathology from the liver biopsy is pending.  I discussed the case with the pathologist here.  The case has been referred out for second opinion and we are awaiting this report.  The most likely differential diagnosis includes hepatocellular carcinoma and cholangiocarcinoma.  I discussed general treatment of these tumors with [name].  She does not appear to be a surgical candidate and I doubt she will be a candidate for hepatic directed therapy.  We will wait on the final pathology report.  We will submit the liver biopsy tissue for molecular testing.  I will present her case at the GI tumor conference.  She will return for an office visit and further discussion in approximately 2 weeks.  Betsy Coder, MD  01/24/2021, 11:01 AM

## 2021-01-25 ENCOUNTER — Telehealth: Payer: Self-pay | Admitting: *Deleted

## 2021-01-25 ENCOUNTER — Telehealth: Payer: Self-pay | Admitting: Oncology

## 2021-01-25 NOTE — Telephone Encounter (Signed)
Scheduled appt per 4/14 sch msg - talked to Patients daughter and she is aware of appt date and time

## 2021-01-25 NOTE — Telephone Encounter (Signed)
Per [facility] radiologist: tissue sent to [facility] for 2nd opinion review returned "undeliverable". He has confirmed address and sent tissue again today. Need to move 4/28 f/u appointment out a week. Scheduling message sent and daughter notified via voice mail rationale and that scheduler will call.

## 2021-02-02 ENCOUNTER — Telehealth: Payer: Self-pay | Admitting: *Deleted

## 2021-02-02 NOTE — Telephone Encounter (Signed)
Per Dr. Benay Spice: pathologist in [place] has read out her path as cholangiocarcinoma/bile duct cancer. Will regroup and discuss treatment on 02/15/21. Daughter notified.

## 2021-02-07 ENCOUNTER — Other Ambulatory Visit: Payer: Self-pay

## 2021-02-07 NOTE — Progress Notes (Signed)
The proposed treatment discussed in conference is for discussion purposes only and is not a binding recommendation.  The patients have not been physically examined, or presented with their treatment options.  Therefore, final treatment plans cannot be decided.   

## 2021-02-08 ENCOUNTER — Ambulatory Visit: Payer: Medicare Other | Admitting: Nurse Practitioner

## 2021-02-14 ENCOUNTER — Encounter (HOSPITAL_COMMUNITY): Payer: Self-pay | Admitting: Oncology

## 2021-02-14 LAB — SURGICAL PATHOLOGY

## 2021-02-15 ENCOUNTER — Other Ambulatory Visit: Payer: Self-pay

## 2021-02-15 ENCOUNTER — Inpatient Hospital Stay: Payer: Medicare Other | Attending: Nurse Practitioner | Admitting: Oncology

## 2021-02-15 VITALS — BP 135/53 | HR 80 | Temp 98.1°F | Resp 18 | Ht 67.0 in | Wt 138.0 lb

## 2021-02-15 DIAGNOSIS — R634 Abnormal weight loss: Secondary | ICD-10-CM | POA: Diagnosis not present

## 2021-02-15 DIAGNOSIS — E271 Primary adrenocortical insufficiency: Secondary | ICD-10-CM | POA: Insufficient documentation

## 2021-02-15 DIAGNOSIS — K625 Hemorrhage of anus and rectum: Secondary | ICD-10-CM | POA: Diagnosis not present

## 2021-02-15 DIAGNOSIS — E039 Hypothyroidism, unspecified: Secondary | ICD-10-CM | POA: Diagnosis not present

## 2021-02-15 DIAGNOSIS — R160 Hepatomegaly, not elsewhere classified: Secondary | ICD-10-CM | POA: Diagnosis not present

## 2021-02-15 DIAGNOSIS — R140 Abdominal distension (gaseous): Secondary | ICD-10-CM | POA: Insufficient documentation

## 2021-02-15 DIAGNOSIS — Z8041 Family history of malignant neoplasm of ovary: Secondary | ICD-10-CM | POA: Insufficient documentation

## 2021-02-15 DIAGNOSIS — C221 Intrahepatic bile duct carcinoma: Secondary | ICD-10-CM | POA: Insufficient documentation

## 2021-02-15 NOTE — Progress Notes (Addendum)
[facility] OFFICE PROGRESS NOTE   Diagnosis: Cholangiocarcinoma  INTERVAL HISTORY:   Ms. Gallego returns as scheduled.  She reports abdominal distention.  No pain.  Good appetite.  She plans to begin an exercise program tomorrow. The pathology from the 01/09/2021 right liver mass biopsy was referred to [facility] for a second opinion.  I was contacted by the [facility] pathologist last week.  They favor an intrahepatic cholangiocarcinoma.  There are foci of hepatocellular differentiation.  The tumor has been submitted for next generation sequencing and the result is pending.  Ms. Lebon is here with her granddaughter.  Objective:  Vital signs in last 24 hours:  Blood pressure (!) 135/53, pulse 80, temperature 98.1 F (36.7 C), temperature source Oral, resp. rate 18, height 5\' 7"  (1.702 m), weight 138 lb (62.6 kg), SpO2 99 %.    Resp: Lungs clear bilaterally Cardio: Regular rate and rhythm GI: Mildly distended with fullness in the right upper abdomen.  No discrete liver edge or mass.  No apparent ascites. Vascular: No leg edema    Lab Results:  Lab Results  Component Value Date   WBC 8.0 01/09/2021   HGB 12.8 01/09/2021   HCT 40.5 01/09/2021   MCV 96.9 01/09/2021   PLT 194 01/09/2021   NEUTROABS 5.8 11/03/2012    CMP  Lab Results  Component Value Date   NA 138 11/03/2012   K 4.0 11/03/2012   CL 98 11/03/2012   CO2 29 11/03/2012   GLUCOSE 101 (H) 11/03/2012   BUN 24 (H) 11/03/2012   CREATININE 0.96 11/03/2012   CALCIUM 9.4 11/03/2012   PROT 7.1 11/03/2012   ALBUMIN 3.9 11/03/2012   AST 30 11/03/2012   ALT 18 11/03/2012   ALKPHOS 61 11/03/2012   BILITOT 0.5 11/03/2012   GFRNONAA 55 (L) 11/03/2012   GFRAA 64 (L) 11/03/2012   Medications: I have reviewed the patient's current medications.   Assessment/Plan: 1. 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  CT abdomen/pelvis 12/21/2020-large right liver mass with peripheral intrahepatic biliary duct  dilatation in the right liver, expansile tumor thrombus occluding the main, left, and right portal veins, mild porta hepatis lymphadenopathy, indeterminate 2 cm hypodense right renal lesion, small volume ascites  MRI abdomen 12/30/2020-9.4 x 6.8 cm liver mass centered in segment 5 with involvement of segments 4A and 8, peripheral intrahepatic biliary ductal dilatation, expansile occlusive tumor thrombus in the main, left, right portal veins, mass characterized as LR-M, solitary 0.9 cm segment 2 lesion suspicious for metastasis, mildly enlarged portacaval node, small volume ascites, right renal cortical mass-renal cell carcinoma not excluded  Ultrasound-guided biopsy of right liver mass on 01/09/2021-pathology refer to [facility]- carcinoma with predominantly glandular and small foci of hepatocellular differentiation  2. Addison's disease 3. Anorexia/weight loss secondary to #1 4. Family history of ovarian cancer 5. Hypothyroidism 6. Intermittent diarrhea-related to the right liver mass? 7. Report of intermittent small-volume rectal bleeding     Disposition: Ms. Emrich appears unchanged.  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  Next generation sequencing testing is pending and may yield additional findings to confirm this diagnosis.  I discussed treatment options with Ms. Gerbino and her granddaughter.  Her case was presented at the GI tumor conference.  She is not a candidate for surgery or hepatic directed therapy.  I discussed systemic treatment options with Ms. Astarita and her daughter.  We also discussed observation.  No therapy will be curative.  We discussed standard systemic therapy with gemcitabine/cisplatin plus  or minus a PD-1 inhibitor.  We also discussed targeted therapies to be based on the next generation sequencing results.  We will obtain PD1 testing in addition to the next generation sequencing.  Ms. Mandeville is unsure whether she would like to consider  systemic therapy.  She requests a second opinion at [facility].  We made a referral today.  She will return for an office visit and further discussion in 2 weeks.  Betsy Coder, MD  02/15/2021  4:08 PM

## 2021-02-16 ENCOUNTER — Encounter: Payer: Self-pay | Admitting: *Deleted

## 2021-02-16 NOTE — Progress Notes (Signed)
Faxed referral order, demographics and chart information to [facility] referral [phone]. Requested that daughter be called w/appointment. Notified radiology to push over the images to [facility].

## 2021-02-22 ENCOUNTER — Encounter: Payer: Self-pay | Admitting: *Deleted

## 2021-02-22 NOTE — Progress Notes (Signed)
Re-faxed referral order, demographics and chart information to [facility] referrals: Fax [phone]. Called [facility] referral line, and per Corene Cornea he received records and will call daughter today to schedule appointment.

## 2021-02-23 ENCOUNTER — Encounter (HOSPITAL_COMMUNITY): Payer: Self-pay

## 2021-02-28 ENCOUNTER — Telehealth: Payer: Self-pay

## 2021-02-28 NOTE — Telephone Encounter (Signed)
TC from [place] at [phone] requesting molecular pathology report report not back yet. When we receive report it will be faxed to [phone]

## 2021-03-01 ENCOUNTER — Ambulatory Visit: Payer: Medicare Other | Admitting: Nurse Practitioner

## 2021-03-07 ENCOUNTER — Encounter (HOSPITAL_COMMUNITY): Payer: Self-pay | Admitting: Oncology

## 2021-03-08 ENCOUNTER — Ambulatory Visit: Payer: Medicare Other | Admitting: Nurse Practitioner

## 2021-03-09 ENCOUNTER — Ambulatory Visit: Payer: Medicare Other | Admitting: Oncology

## 2021-03-13 ENCOUNTER — Encounter: Payer: Self-pay | Admitting: *Deleted

## 2021-03-13 NOTE — Progress Notes (Signed)
Sharyn Creamer molecular testing results to Franchot Gallo at [facility] #[phone]. Phone [phone].

## 2021-03-27 ENCOUNTER — Inpatient Hospital Stay: Payer: Medicare Other | Attending: Nurse Practitioner | Admitting: Oncology

## 2021-03-27 ENCOUNTER — Other Ambulatory Visit: Payer: Self-pay

## 2021-03-27 ENCOUNTER — Telehealth: Payer: Self-pay | Admitting: *Deleted

## 2021-03-27 VITALS — BP 130/63 | HR 71 | Temp 97.8°F | Resp 18 | Ht 67.0 in | Wt 136.6 lb

## 2021-03-27 DIAGNOSIS — R197 Diarrhea, unspecified: Secondary | ICD-10-CM | POA: Insufficient documentation

## 2021-03-27 DIAGNOSIS — E039 Hypothyroidism, unspecified: Secondary | ICD-10-CM | POA: Insufficient documentation

## 2021-03-27 DIAGNOSIS — R634 Abnormal weight loss: Secondary | ICD-10-CM | POA: Diagnosis not present

## 2021-03-27 DIAGNOSIS — Z8041 Family history of malignant neoplasm of ovary: Secondary | ICD-10-CM | POA: Diagnosis not present

## 2021-03-27 DIAGNOSIS — R630 Anorexia: Secondary | ICD-10-CM | POA: Diagnosis not present

## 2021-03-27 DIAGNOSIS — K625 Hemorrhage of anus and rectum: Secondary | ICD-10-CM | POA: Diagnosis not present

## 2021-03-27 DIAGNOSIS — Z7189 Other specified counseling: Secondary | ICD-10-CM

## 2021-03-27 DIAGNOSIS — C221 Intrahepatic bile duct carcinoma: Secondary | ICD-10-CM | POA: Insufficient documentation

## 2021-03-27 NOTE — Telephone Encounter (Signed)
Called to schedule port placement. Patient had agreed to 6/15 and then remembered something is planned that day. Requesting 6/17 or 6/20. [facility] has nothing till 6/30. Called and left VM w/IR scheduler at [facility] with request.

## 2021-03-27 NOTE — Progress Notes (Signed)
START OFF PATHWAY REGIMEN - Other   [id number]:Cisplatin 80 mg/m2 IV D1 + Gemcitabine 1,000 mg/m2 IV D1,8 q21 Days:   A cycle is every 21 days:     Gemcitabine      Cisplatin   **Always confirm dose/schedule in your pharmacy ordering system**  Patient Characteristics: Intent of Therapy: Non-Curative / Palliative Intent, Discussed with Patient

## 2021-03-27 NOTE — Progress Notes (Signed)
[facility] OFFICE VISIT PROGRESS NOTE  I connected with [name] on 03/27/21 at 10:00 AM EDT by video enabled telemedicine visit and verified that I am speaking with the correct person using two identifiers.   I discussed the limitations, risks, security and privacy concerns of performing an evaluation and management service by telemedicine and the availability of in-person appointments. I also discussed with the patient that there may be a patient responsible charge related to this service. The patient expressed understanding and agreed to proceed.  Other persons participating in the visit and their role in the encounter: Daughter  Patient's location: Office Provider's location: Home   Diagnosis: Cholangiocarcinoma  INTERVAL HISTORY:  [name] returns for a scheduled visit.  She is here today with her daughter.  She reports feeling well.  She has been able to gain weight by eating more frequently.  No nausea or pain.  She saw Dr. Oralia Rud on 03/14/2021.  Dr. Oralia Rud discussed supportive care versus a trial of systemic therapy with [name].  She is not a candidate for surgery or hepatic directed therapy.  Treatment with gemcitabine/cisplatin is recommended.  [name] has decided to proceed with a trial of systemic chemotherapy.   Objective:  Vital signs in last 24 hours:  Blood pressure 130/63, pulse 71, temperature 97.8 F (36.6 C), temperature source Oral, resp. rate 18, height 5\' 7"  (1.702 m), weight 136 lb 9.6 oz (62 kg), SpO2 98 %.     Lab Results:  Lab Results  Component Value Date   WBC 8.0 01/09/2021   HGB 12.8 01/09/2021   HCT 40.5 01/09/2021   MCV 96.9 01/09/2021   PLT 194 01/09/2021   NEUTROABS 5.8 11/03/2012    Imaging:  No results found.  Medications: I have reviewed the patient's current medications.  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 CT  abdomen/pelvis 12/21/2020-large right liver mass with peripheral intrahepatic biliary duct dilatation in the right liver, expansile tumor thrombus occluding the main, left, and right portal veins, mild porta hepatis lymphadenopathy, indeterminate 2 cm hypodense right renal lesion, small volume ascites MRI abdomen 12/30/2020-9.4 x 6.8 cm liver mass centered in segment 5 with involvement of segments 4A and 8, peripheral intrahepatic biliary ductal dilatation, expansile occlusive tumor thrombus in the main, left, right portal veins, mass characterized as LR-M, solitary 0.9 cm segment 2 lesion suspicious for metastasis, mildly enlarged portacaval node, small volume ascites, right renal cortical mass-renal cell carcinoma not excluded Ultrasound-guided biopsy of right liver mass on 01/09/2021-pathology refer to [facility]- carcinoma with predominantly glandular and small foci of hepatocellular differentiation cholangiocarcinoma favored  Addison's disease Anorexia/weight loss secondary to #1 Family history of ovarian cancer Hypothyroidism Intermittent diarrhea-related to the right liver mass? Report of intermittent small-volume rectal bleeding    Disposition: [name] has been diagnosed with intrahepatic cholangiocarcinoma.  She was seen in consultation by Dr. Oralia Rud.  [name] is not a candidate for surgery or hepatic directed therapy.  She understands no therapy will be curative.  We discussed comfort care versus a trial of systemic therapy.  Dr. Oralia Rud recommends treatment with dose reduced gemcitabine/cisplatin.  [name] would like to proceed with this regimen.  She understands the need for Port-A-Cath [name].  We reviewed potential toxicities associated with the gemcitabine/cisplatin regimen including the chance of nausea/vomiting, alopecia, infection, bleeding, and hematologic toxicity.  We discussed the fever, rash, and pneumonitis associated with gemcitabine.  We discussed the neuropathy,  nephrotoxicity,  and ototoxicity associated with cisplatin.  She agrees to proceed.  She will attend a chemotherapy teaching class.  [name] has a planned vacation for next week.  She will be scheduled for Port-A-Cath [name] and chemotherapy during the week of 04/09/2021.  We will follow-up on molecular testing obtained by [facility] Chemotherapy plan was entered today. I discussed the assessment and treatment plan with the patient. The patient was provided an opportunity to ask questions and all were answered. The patient agreed with the plan and demonstrated an understanding of the instructions.   The patient was advised to call back or seek an in-person evaluation if the symptoms worsen or if the condition fails to improve as anticipated.  I provided 50 minutes video, chart review, and documentation time during this encounter, and > 50% was spent counseling as documented under my assessment & plan.  Betsy Coder ANP/GNP-BC   03/27/2021 10:12 AM

## 2021-03-28 ENCOUNTER — Other Ambulatory Visit: Payer: Self-pay | Admitting: Radiology

## 2021-03-28 ENCOUNTER — Other Ambulatory Visit (HOSPITAL_COMMUNITY): Payer: Medicare Other

## 2021-03-29 ENCOUNTER — Other Ambulatory Visit: Payer: Self-pay | Admitting: Radiology

## 2021-03-30 ENCOUNTER — Ambulatory Visit (HOSPITAL_COMMUNITY)
Admission: RE | Admit: 2021-03-30 | Discharge: 2021-03-30 | Disposition: A | Discharge status: Home / Self Care | Payer: Medicare Other | Source: Ambulatory Visit | Attending: Oncology | Admitting: Oncology

## 2021-03-30 ENCOUNTER — Other Ambulatory Visit: Payer: Self-pay

## 2021-03-30 ENCOUNTER — Other Ambulatory Visit: Payer: Self-pay | Admitting: Oncology

## 2021-03-30 DIAGNOSIS — Z79899 Other long term (current) drug therapy: Secondary | ICD-10-CM | POA: Diagnosis not present

## 2021-03-30 DIAGNOSIS — Z87891 Personal history of nicotine dependence: Secondary | ICD-10-CM | POA: Insufficient documentation

## 2021-03-30 DIAGNOSIS — C221 Intrahepatic bile duct carcinoma: Secondary | ICD-10-CM | POA: Diagnosis not present

## 2021-03-30 DIAGNOSIS — Z79890 Hormone replacement therapy (postmenopausal): Secondary | ICD-10-CM | POA: Diagnosis not present

## 2021-03-30 HISTORY — PX: IR IMAGING GUIDED PORT INSERTION: IMG5740

## 2021-03-30 HISTORY — PX: IR FLUORO GUIDE CV LINE RIGHT: IMG2283

## 2021-03-30 MED ORDER — MIDAZOLAM HCL 2 MG/2ML IJ SOLN
INTRAMUSCULAR | Status: AC | PRN
  Administered 2021-03-30: 1 mg via INTRAVENOUS

## 2021-03-30 MED ORDER — LIDOCAINE HCL (PF) 1 % IJ SOLN
INTRAMUSCULAR | Status: AC
  Filled 2021-03-30: qty 30

## 2021-03-30 MED ORDER — FENTANYL CITRATE (PF) 100 MCG/2ML IJ SOLN
INTRAMUSCULAR | Status: AC | PRN
  Administered 2021-03-30 (×2): 25 ug via INTRAVENOUS

## 2021-03-30 MED ORDER — LIDOCAINE-EPINEPHRINE 1 %-1:100000 IJ SOLN
INTRAMUSCULAR | Status: AC
  Filled 2021-03-30: qty 1

## 2021-03-30 MED ORDER — MIDAZOLAM HCL 2 MG/2ML IJ SOLN
INTRAMUSCULAR | Status: AC
  Filled 2021-03-30: qty 2

## 2021-03-30 MED ORDER — LIDOCAINE HCL 1 % IJ SOLN
INTRAMUSCULAR | Status: AC | PRN
  Administered 2021-03-30: 10 mL

## 2021-03-30 MED ORDER — SODIUM CHLORIDE 0.9 % IV SOLN: INTRAVENOUS | Status: DC

## 2021-03-30 MED ORDER — LIDOCAINE-EPINEPHRINE 1 %-1:100000 IJ SOLN
INTRAMUSCULAR | Status: AC | PRN
  Administered 2021-03-30: 10 mL

## 2021-03-30 MED ORDER — FENTANYL CITRATE (PF) 100 MCG/2ML IJ SOLN
INTRAMUSCULAR | Status: AC
  Filled 2021-03-30: qty 2

## 2021-03-30 MED ORDER — HEPARIN SOD (PORK) LOCK FLUSH 100 UNIT/ML IV SOLN
INTRAVENOUS | Status: AC | PRN
  Administered 2021-03-30: 500 [IU]

## 2021-03-30 MED ORDER — HEPARIN SOD (PORK) LOCK FLUSH 100 UNIT/ML IV SOLN
INTRAVENOUS | Status: AC
  Filled 2021-03-30: qty 5

## 2021-03-30 NOTE — Procedures (Signed)
Interventional Radiology Procedure:   Indications: Cholangiocarcinoma  Procedure: Port placement  Findings: Right jugular port, tip at SVC/RA junction  Complications: None     EBL: Minimal, less than 10 ml  Plan: Discharge in one hour.  Keep port site and incisions dry for at least 24 hours.     Laurel Smeltz R. Anselm Pancoast, MD  Pager: [phone]

## 2021-03-30 NOTE — H&P (Signed)
Chief Complaint: Patient was seen in consultation today for port placement at the request of Sherrill,Gary B  Referring Physician(s): Ladell Pier  Supervising Physician: Markus Daft  Patient Status: [facility] - In-pt  History of Present Illness: [name] is a 87 y.o. female with cholangiocarcinoma. She is referred for port placement. PMHx, meds, labs, imaging, allergies reviewed. Feels well, no recent fevers, chills, illness. Has been NPO today as directed.    Past Medical History:  Diagnosis Date   Addison's disease ([place])    Hypothyroidism    Osteoporosis     Past Surgical History:  Procedure Laterality Date   ganglion cyst foot      Allergies: Patient has no known allergies.  Medications: Prior to Admission medications   Medication Sig Start Date End Date Taking? Authorizing Provider  Calcium Citrate-Vitamin D (CALCIUM + D PO) Take 1 tablet by mouth daily.   Yes [provider]  Cholecalciferol (VITAMIN D) 50 MCG (2000 UT) tablet Take 2,000 Units by mouth daily.   Yes [provider]  fludrocortisone (FLORINEF) 0.1 MG tablet Take 0.05 mg by mouth daily.   Yes [provider]  hydrocortisone (CORTEF) 10 MG tablet Take 5-10 mg by mouth See admin instructions. Take 10 mg at 7 am, 5 mg at noon, 5 mg at 3pm   Yes [provider]  levothyroxine (SYNTHROID, LEVOTHROID) 88 MCG tablet Take 88 mcg by mouth daily.   Yes [provider]  Wheat Dextrin (BENEFIBER PO) Take 3 tablets by mouth daily as needed (constipation).   Yes [provider]  calcium carbonate (TUMS - DOSED IN MG ELEMENTAL CALCIUM) 500 MG chewable tablet Chew 500 mg by mouth daily as needed for indigestion or heartburn.    [provider]  ondansetron (ZOFRAN) 4 MG tablet Take 4 mg by mouth every 8 (eight) hours as needed for nausea or vomiting.    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  Tobacco Use   Smoking status: Former    Packs/day: 0.50    Years: 7.00    Pack years: 3.50    Types: Cigarettes    Quit date: 01/30/1979    Years since quitting: 42.1   Smokeless tobacco: Never  Vaping Use   Vaping Use: Never used  Substance and Sexual Activity   Alcohol use: Yes    Alcohol/week: 1.0 standard drink    Types: 1 Glasses of wine per week   Drug use: Never   Sexual activity: Not Currently  Other Topics Concern   Not on file  Social History Narrative   Not on file   Social Determinants of Health   Financial Resource Strain: Not on file  Food Insecurity: Not on file  Transportation Needs: Not on file  Physical Activity: Not on file  Stress: Not on file  Social Connections: Not on file    Review of Systems: A 12 point ROS discussed and pertinent positives are indicated in  the HPI above.  All other systems are negative.  Review of Systems  Vital Signs: BP (!) 147/76   Pulse 80   Temp 98.4 F (36.9 C) (Oral)   Resp 18   Ht 5\' 6"  (1.676 m)   Wt 60.3 kg   SpO2 100%   BMI 21.47 kg/m   Physical Exam Constitutional:      Appearance: Normal appearance. She is not ill-appearing.  HENT:     Mouth/Throat:     Mouth: Mucous membranes are moist.     Pharynx: Oropharynx is clear.  Cardiovascular:     Rate and Rhythm: Normal rate and regular rhythm.     Heart sounds: Normal heart sounds.  Pulmonary:     Effort: Pulmonary effort is normal. No respiratory distress.     Breath sounds: Normal breath sounds.  Skin:    General: Skin is warm and dry.  Neurological:     General: No focal deficit  present.     Mental Status: She is alert and oriented to person, place, and time.  Psychiatric:        Mood and Affect: Mood normal.        Thought Content: Thought content normal.        Judgment: Judgment normal.      Imaging: No results found.  Labs:  CBC: Recent Labs    01/09/21 1125  WBC 8.0  HGB 12.8  HCT 40.5  PLT 194    COAGS: Recent Labs    01/09/21 1125  INR 0.9      TUMOR MARKERS: Recent Labs    12/20/20 1203  AFPTM 20.0*    Assessment and Plan: Cholangiocarcinoma For port placement Risks and benefits of image guided port-a-catheter placement was discussed with the patient including, but not limited to bleeding, infection, pneumothorax, or fibrin sheath development and need for additional procedures.  All of the patient's questions were answered, patient is agreeable to proceed. Consent signed and in chart.   Thank you for this interesting consult.  I greatly enjoyed meeting [name] and look forward to participating in their care.  A copy of this report was sent to the requesting provider on this date.  Electronically Signed: Ascencion Dike, PA-C 03/30/2021, 11:26 AM   I spent a total of 15 minutes in face to face in clinical consultation, greater than 50% of which was counseling/coordinating care for port placement

## 2021-04-06 ENCOUNTER — Other Ambulatory Visit: Payer: Self-pay

## 2021-04-06 ENCOUNTER — Inpatient Hospital Stay: Payer: Medicare Other

## 2021-04-06 ENCOUNTER — Other Ambulatory Visit: Payer: Self-pay | Admitting: Oncology

## 2021-04-06 DIAGNOSIS — C221 Intrahepatic bile duct carcinoma: Secondary | ICD-10-CM

## 2021-04-06 LAB — CBC WITH DIFFERENTIAL (CANCER CENTER ONLY)
Abs Immature Granulocytes: 0.05 10*3/uL (ref 0.00–0.07)
Basophils Absolute: 0.1 10*3/uL (ref 0.0–0.1)
Basophils Relative: 1 %
Eosinophils Absolute: 0.1 10*3/uL (ref 0.0–0.5)
Eosinophils Relative: 1 %
HCT: 41.2 % (ref 36.0–46.0)
Hemoglobin: 13.1 g/dL (ref 12.0–15.0)
Immature Granulocytes: 1 %
Lymphocytes Relative: 12 %
Lymphs Abs: 1.2 10*3/uL (ref 0.7–4.0)
MCH: 30.9 pg (ref 26.0–34.0)
MCHC: 31.8 g/dL (ref 30.0–36.0)
MCV: 97.2 fL (ref 80.0–100.0)
Monocytes Absolute: 0.8 10*3/uL (ref 0.1–1.0)
Monocytes Relative: 8 %
Neutro Abs: 7.6 10*3/uL (ref 1.7–7.7)
Neutrophils Relative %: 77 %
Platelet Count: 237 10*3/uL (ref 150–400)
RBC: 4.24 MIL/uL (ref 3.87–5.11)
RDW: 14.3 % (ref 11.5–15.5)
WBC Count: 9.7 10*3/uL (ref 4.0–10.5)
nRBC: 0 % (ref 0.0–0.2)

## 2021-04-06 LAB — CMP (CANCER CENTER ONLY)
ALT: 29 U/L (ref 0–44)
AST: 49 U/L — ABNORMAL HIGH (ref 15–41)
Albumin: 4.1 g/dL (ref 3.5–5.0)
Alkaline Phosphatase: 395 U/L — ABNORMAL HIGH (ref 38–126)
Anion gap: 9 (ref 5–15)
BUN: 19 mg/dL (ref 8–23)
CO2: 30 mmol/L (ref 22–32)
Calcium: 10.4 mg/dL — ABNORMAL HIGH (ref 8.9–10.3)
Chloride: 100 mmol/L (ref 98–111)
Creatinine: 0.7 mg/dL (ref 0.44–1.00)
GFR, Estimated: >60 mL/min (ref >60)
Glucose, Bld: 95 mg/dL (ref 70–99)
Potassium: 3.9 mmol/L (ref 3.5–5.1)
Sodium: 139 mmol/L (ref 135–145)
Total Bilirubin: 1 mg/dL (ref 0.3–1.2)
Total Protein: 6.7 g/dL (ref 6.5–8.1)

## 2021-04-06 LAB — MAGNESIUM: Magnesium: 1.7 mg/dL (ref 1.7–2.4)

## 2021-04-06 MED ORDER — LIDOCAINE-PRILOCAINE 2.5-2.5 % EX CREA: 1.0000 "application " | TOPICAL_CREAM | CUTANEOUS | 1 refills | Status: AC

## 2021-04-06 MED ORDER — PROCHLORPERAZINE MALEATE 5 MG PO TABS: 5.0000 mg | ORAL_TABLET | Freq: Four times a day (QID) | ORAL | 0 refills | Status: AC | PRN

## 2021-04-06 MED ORDER — ONDANSETRON HCL 8 MG PO TABS: 8.0000 mg | ORAL_TABLET | Freq: Three times a day (TID) | ORAL | 1 refills | Status: DC | PRN

## 2021-04-06 NOTE — Progress Notes (Signed)
Pharmacist Chemotherapy Monitoring - Initial Assessment    Anticipated start date: 04/13/21   The following has been reviewed per standard work regarding the patient's treatment regimen: The patient's diagnosis, treatment plan and drug doses, and organ/hematologic function Lab orders and baseline tests specific to treatment regimen  The treatment plan start date, drug sequencing, and pre-medications Prior authorization status  Patient's documented medication list, including drug-drug interaction screen and prescriptions for anti-emetics and supportive care specific to the treatment regimen The drug concentrations, fluid compatibility, administration routes, and timing of the medications to be used The patient's access for treatment and lifetime cumulative dose history, if applicable  The patient's medication allergies and previous infusion related reactions, if applicable   Changes made to treatment plan:  N/A  Follow up needed:  N/A  Roselind Messier, PharmD 04/06/2021  11:05 AM

## 2021-04-07 ENCOUNTER — Other Ambulatory Visit: Payer: Self-pay | Admitting: Oncology

## 2021-04-09 ENCOUNTER — Telehealth: Payer: Self-pay | Admitting: Oncology

## 2021-04-09 NOTE — Telephone Encounter (Signed)
Scheduled appt per 6/24 sch msg. Pt aware.  

## 2021-04-09 NOTE — Progress Notes (Signed)
The following biosimilar Ziextenzo (pegfilgrastim-bmez) has been selected for use in this patient.  Kennith Center, Pharm.D., CPP 04/09/2021@2 :07 PM

## 2021-04-10 ENCOUNTER — Other Ambulatory Visit: Payer: Self-pay

## 2021-04-10 ENCOUNTER — Inpatient Hospital Stay: Payer: Medicare Other | Admitting: Dietician

## 2021-04-10 NOTE — Progress Notes (Signed)
Nutrition Assessment   Reason for Assessment: Provider request   ASSESSMENT: 87 year old female with newly diagnosed intrahepatic cholangiocarcinoma. Plans to begin trial systemic chemotherapy with gemcitabine/cisplatin on 7/1. She is followed by Dr. Benay Spice.  Past medical history of Addison's, hypothyroidism, osteoporosis, HOH.  Met with patient and her daughter today in clinic. Patient reports decreased appetite and increased fatigue. She weighed 129 lb this morning at home, says she is weighing herself every morning. Patient reports abdominal distention, but this comes and goes. She states bloating is uncomfortable, but manageable. She is not having constipation or diarrhea. Patient does not eat much meat, taste does not appeal to her. She eats 3 small meals, drinks 1 Ensure mid morning, and occasionally will have a cracker with cheese for afternoon snack. Sometimes she has a "2 finger" pour of wine. She is trying to increase her water intake, reports ~4 glasses/day. Yesterday patient had an egg, grits with butter, plain toast with 1 cup coffee for breakfast, Ensure Original for snack, peanut butter/mayo sandwich and 1 banana for lunch, and very small amount (2-3 oz) piece of beef, carrots, peas, and small gravy biscuit for dinner.    Medications: Tums, Ca citrate, vit D, Florineff, Cortef, Zofran, Compazine, Benefiber   Labs: 6/24 Ca 10.4   Anthropometrics: Patient reports 129 lb this morning on home scale.  Height: 5'6" Weight: 133 lb (5/16) UBW: 130 lb (Aug-Oct 2021) BMI: 21.47   NUTRITION DIAGNOSIS: Inadequate oral intake related to cancer as evidenced by reported poor appetite, occasional abdominal bloating, fatigue, and 4 lb (3%) decrease in weight on home scale in 2 weeks.    INTERVENTION:  Educated on the importance of adequate calorie and protein energy intake to maintain strength, weights, nutrition  Encouraged small frequent nutrient dense small meals and snacks High  calorie, high protein recipes for meals/snacks given Discussed ways to add calories and protein to foods Recommended drinking 2 Ensure Plus/day (350 kcal, 16 g protein each) Ensure coupons provided Discussed strategies for fatigue, encouraged activity as able - handout provided Discussed strategies for poor appetite, handout provided Food safety fact sheet given Contact information given  MONITORING, EVALUATION, GOAL: Patient will tolerate increased calories and protein to minimize weight loss   Next Visit: Wednesday July 13 via telephone (conference call with pt and daughter)

## 2021-04-12 ENCOUNTER — Other Ambulatory Visit: Payer: Self-pay

## 2021-04-12 ENCOUNTER — Inpatient Hospital Stay: Payer: Medicare Other

## 2021-04-12 ENCOUNTER — Inpatient Hospital Stay (HOSPITAL_BASED_OUTPATIENT_CLINIC_OR_DEPARTMENT_OTHER): Payer: Medicare Other | Admitting: Oncology

## 2021-04-12 VITALS — BP 128/56 | HR 66 | Temp 97.8°F | Resp 18 | Ht 66.0 in | Wt 131.8 lb

## 2021-04-12 DIAGNOSIS — C221 Intrahepatic bile duct carcinoma: Secondary | ICD-10-CM

## 2021-04-12 DIAGNOSIS — Z95828 Presence of other vascular implants and grafts: Secondary | ICD-10-CM

## 2021-04-12 LAB — CBC WITH DIFFERENTIAL (CANCER CENTER ONLY)
Abs Immature Granulocytes: 0.06 10*3/uL (ref 0.00–0.07)
Basophils Absolute: 0.1 10*3/uL (ref 0.0–0.1)
Basophils Relative: 1 %
Eosinophils Absolute: 0.1 10*3/uL (ref 0.0–0.5)
Eosinophils Relative: 1 %
HCT: 39.3 % (ref 36.0–46.0)
Hemoglobin: 12.6 g/dL (ref 12.0–15.0)
Immature Granulocytes: 1 %
Lymphocytes Relative: 9 %
Lymphs Abs: 0.9 10*3/uL (ref 0.7–4.0)
MCH: 30.7 pg (ref 26.0–34.0)
MCHC: 32.1 g/dL (ref 30.0–36.0)
MCV: 95.6 fL (ref 80.0–100.0)
Monocytes Absolute: 0.9 10*3/uL (ref 0.1–1.0)
Monocytes Relative: 8 %
Neutro Abs: 8.8 10*3/uL — ABNORMAL HIGH (ref 1.7–7.7)
Neutrophils Relative %: 80 %
Platelet Count: 254 10*3/uL (ref 150–400)
RBC: 4.11 MIL/uL (ref 3.87–5.11)
RDW: 14.1 % (ref 11.5–15.5)
WBC Count: 10.7 10*3/uL — ABNORMAL HIGH (ref 4.0–10.5)
nRBC: 0 % (ref 0.0–0.2)

## 2021-04-12 LAB — CMP (CANCER CENTER ONLY)
ALT: 26 U/L (ref 0–44)
AST: 48 U/L — ABNORMAL HIGH (ref 15–41)
Albumin: 3.9 g/dL (ref 3.5–5.0)
Alkaline Phosphatase: 369 U/L — ABNORMAL HIGH (ref 38–126)
Anion gap: 10 (ref 5–15)
BUN: 17 mg/dL (ref 8–23)
CO2: 29 mmol/L (ref 22–32)
Calcium: 10.1 mg/dL (ref 8.9–10.3)
Chloride: 99 mmol/L (ref 98–111)
Creatinine: 0.77 mg/dL (ref 0.44–1.00)
GFR, Estimated: >60 mL/min (ref >60)
Glucose, Bld: 105 mg/dL — ABNORMAL HIGH (ref 70–99)
Potassium: 3.2 mmol/L — ABNORMAL LOW (ref 3.5–5.1)
Sodium: 138 mmol/L (ref 135–145)
Total Bilirubin: 0.8 mg/dL (ref 0.3–1.2)
Total Protein: 6.9 g/dL (ref 6.5–8.1)

## 2021-04-12 MED ORDER — HEPARIN SOD (PORK) LOCK FLUSH 100 UNIT/ML IV SOLN
500.0000 [IU] | Freq: Once | INTRAVENOUS | Status: AC
Start: 2021-04-12 — End: 2021-04-12
  Administered 2021-04-12: 500 [IU] via INTRAVENOUS
  Filled 2021-04-12: qty 5

## 2021-04-12 MED ORDER — SODIUM CHLORIDE 0.9% FLUSH
10.0000 mL | Freq: Once | INTRAVENOUS | Status: AC
  Administered 2021-04-12: 10 mL via INTRAVENOUS
  Filled 2021-04-12: qty 10

## 2021-04-12 NOTE — Progress Notes (Signed)
  [place] OFFICE PROGRESS NOTE   Diagnosis: Cholangiocarcinoma  INTERVAL HISTORY:   [name] returns as scheduled.  She underwent Port-A-Cath placement 03/30/2021.  She has attended a chemotherapy teaching class.  She is scheduled to begin gemcitabine/cisplatin tomorrow.  No new complaint.  Her appetite has improved.  Objective:  Vital signs in last 24 hours:  Blood pressure (!) 128/56, pulse 66, temperature 97.8 F (36.6 C), temperature source Oral, resp. rate 18, height 5\' 6"  (1.676 m), weight 131 lb 12.8 oz (59.8 kg), SpO2 100 %.   Resp: Lungs clear bilaterally Cardio: Regular rate and rhythm GI: No hepatomegaly, nontender Vascular: No leg edema  Portacath/PICC-without erythema  Lab Results:  Lab Results  Component Value Date   WBC 10.7 (H) 04/12/2021   HGB 12.6 04/12/2021   HCT 39.3 04/12/2021   MCV 95.6 04/12/2021   PLT 254 04/12/2021   NEUTROABS 8.8 (H) 04/12/2021    CMP  Lab Results  Component Value Date   NA 138 04/12/2021   K 3.2 (L) 04/12/2021   CL 99 04/12/2021   CO2 29 04/12/2021   GLUCOSE 105 (H) 04/12/2021   BUN 17 04/12/2021   CREATININE 0.77 04/12/2021   CALCIUM 10.1 04/12/2021   PROT 6.9 04/12/2021   ALBUMIN 3.9 04/12/2021   AST 48 (H) 04/12/2021   ALT 26 04/12/2021   ALKPHOS 369 (H) 04/12/2021   BILITOT 0.8 04/12/2021   GFRNONAA >60 04/12/2021   GFRAA 64 (L) 11/03/2012     Medications: I have reviewed the patient's current medications.   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 CT abdomen/pelvis 12/21/2020-large right liver mass with peripheral intrahepatic biliary duct dilatation in the right liver, expansile tumor thrombus occluding the main, left, and right portal veins, mild porta hepatis lymphadenopathy, indeterminate 2 cm hypodense right renal lesion, small volume ascites MRI abdomen 12/30/2020-9.4 x 6.8 cm liver mass centered in segment 5 with involvement of  segments 4A and 8, peripheral intrahepatic biliary ductal dilatation, expansile occlusive tumor thrombus in the main, left, right portal veins, mass characterized as LR-M, solitary 0.9 cm segment 2 lesion suspicious for metastasis, mildly enlarged portacaval node, small volume ascites, right renal cortical mass-renal cell carcinoma not excluded Ultrasound-guided biopsy of right liver mass on 01/09/2021-pathology refer to [facility]- carcinoma with predominantly glandular and small foci of hepatocellular differentiation cholangiocarcinoma favored  Addison's disease Anorexia/weight loss secondary to #1 Family history of ovarian cancer Hypothyroidism Intermittent diarrhea-related to the right liver mass? Report of intermittent small-volume rectal bleeding      Disposition: Ms. Krzyzanowski appears stable.  She is scheduled to begin systemic therapy with gemcitabine/cisplatin.  I reviewed the treatment plan with Ms. Rylander and her daughter.  She will return for cycle 1 day 1 chemotherapy tomorrow.  She will not receive G-CSF support with cycle 1.  She knows to call for a fever or symptoms of an infection.  She will return for an office visit on day 1 cycle 2.    Betsy Coder, MD  04/12/2021  10:47 AM

## 2021-04-12 NOTE — Patient Instructions (Signed)
Implanted Port Home Guide An implanted port is a device that is placed under the skin. It is usually placed in the chest. The device can be used to give IV medicine, to take blood, or for dialysis. You may have an implanted port if: You need IV medicine that would be irritating to the small veins in your hands or arms. You need IV medicines, such as antibiotics, for a long period of time. You need IV nutrition for a long period of time. You need dialysis. When you have a port, your health care provider can choose to use the port instead of veins in your arms for these procedures. You may have fewer limitations when using a port than you would if you used other types of long-term IVs, and you will likely be able to return to normal activities afteryour incision heals. An implanted port has two main parts: Reservoir. The reservoir is the part where a needle is inserted to give medicines or draw blood. The reservoir is round. After it is placed, it appears as a small, raised area under your skin. Catheter. The catheter is a thin, flexible tube that connects the reservoir to a vein. Medicine that is inserted into the reservoir goes into the catheter and then into the vein. How is my port accessed? To access your port: A numbing cream may be placed on the skin over the port site. Your health care provider will put on a mask and sterile gloves. The skin over your port will be cleaned carefully with a germ-killing soap and allowed to dry. Your health care provider will gently pinch the port and insert a needle into it. Your health care provider will check for a blood return to make sure the port is in the vein and is not clogged. If your port needs to remain accessed to get medicine continuously (constant infusion), your health care provider will place a clear bandage (dressing) over the needle site. The dressing and needle will need to be changed every week, or as told by your health care provider. What  is flushing? Flushing helps keep the port from getting clogged. Follow instructions from your health care provider about how and when to flush the port. Ports are usually flushed with saline solution or a medicine called heparin. The need for flushing will depend on how the port is used: If the port is only used from time to time to give medicines or draw blood, the port may need to be flushed: Before and after medicines have been given. Before and after blood has been drawn. As part of routine maintenance. Flushing may be recommended every 4-6 weeks. If a constant infusion is running, the port may not need to be flushed. Throw away any syringes in a disposal container that is meant for sharp items (sharps container). You can buy a sharps container from a pharmacy, or you can make one by using an empty hard plastic bottle with a cover. How long will my port stay implanted? The port can stay in for as long as your health care provider thinks it is needed. When it is time for the port to come out, a surgery will be done to remove it. The surgery will be similar to the procedure that was done to putthe port in. Follow these instructions at home:  Flush your port as told by your health care provider. If you need an infusion over several days, follow instructions from your health care provider about how to take   care of your port site. Make sure you: Wash your hands with soap and water before you change your dressing. If soap and water are not available, use alcohol-based hand sanitizer. Change your dressing as told by your health care provider. Place any used dressings or infusion bags into a plastic bag. Throw that bag in the trash. Keep the dressing that covers the needle clean and dry. Do not get it wet. Do not use scissors or sharp objects near the tube. Keep the tube clamped, unless it is being used. Check your port site every day for signs of infection. Check for: Redness, swelling, or  pain. Fluid or blood. Pus or a bad smell. Protect the skin around the port site. Avoid wearing bra straps that rub or irritate the site. Protect the skin around your port from seat belts. Place a soft pad over your chest if needed. Bathe or shower as told by your health care provider. The site may get wet as long as you are not actively receiving an infusion. Return to your normal activities as told by your health care provider. Ask your health care provider what activities are safe for you. Carry a medical alert card or wear a medical alert bracelet at all times. This will let health care providers know that you have an implanted port in case of an emergency. Get help right away if: You have redness, swelling, or pain at the port site. You have fluid or blood coming from your port site. You have pus or a bad smell coming from the port site. You have a fever. Summary Implanted ports are usually placed in the chest for long-term IV access. Follow instructions from your health care provider about flushing the port and changing bandages (dressings). Take care of the area around your port by avoiding clothing that puts pressure on the area, and by watching for signs of infection. Protect the skin around your port from seat belts. Place a soft pad over your chest if needed. Get help right away if you have a fever or you have redness, swelling, pain, drainage, or a bad smell at the port site. This information is not intended to replace advice given to you by your health care provider. Make sure you discuss any questions you have with your healthcare provider. Document Revised: 02/14/2020 Document Reviewed: 02/14/2020 [employer] Patient Education  2022 [employer]  

## 2021-04-13 ENCOUNTER — Inpatient Hospital Stay: Payer: Medicare Other | Attending: Nurse Practitioner

## 2021-04-13 ENCOUNTER — Telehealth: Payer: Self-pay

## 2021-04-13 VITALS — BP 123/53 | HR 72 | Temp 98.2°F | Resp 18

## 2021-04-13 DIAGNOSIS — K625 Hemorrhage of anus and rectum: Secondary | ICD-10-CM | POA: Insufficient documentation

## 2021-04-13 DIAGNOSIS — Z5111 Encounter for antineoplastic chemotherapy: Secondary | ICD-10-CM | POA: Insufficient documentation

## 2021-04-13 DIAGNOSIS — R5381 Other malaise: Secondary | ICD-10-CM | POA: Diagnosis not present

## 2021-04-13 DIAGNOSIS — E271 Primary adrenocortical insufficiency: Secondary | ICD-10-CM | POA: Insufficient documentation

## 2021-04-13 DIAGNOSIS — Z8041 Family history of malignant neoplasm of ovary: Secondary | ICD-10-CM | POA: Insufficient documentation

## 2021-04-13 DIAGNOSIS — E039 Hypothyroidism, unspecified: Secondary | ICD-10-CM | POA: Insufficient documentation

## 2021-04-13 DIAGNOSIS — R197 Diarrhea, unspecified: Secondary | ICD-10-CM | POA: Insufficient documentation

## 2021-04-13 DIAGNOSIS — C221 Intrahepatic bile duct carcinoma: Secondary | ICD-10-CM | POA: Insufficient documentation

## 2021-04-13 DIAGNOSIS — R634 Abnormal weight loss: Secondary | ICD-10-CM | POA: Diagnosis not present

## 2021-04-13 MED ORDER — SODIUM CHLORIDE 0.9 % IV SOLN
150.0000 mg | Freq: Once | INTRAVENOUS | Status: AC
  Administered 2021-04-13: 150 mg via INTRAVENOUS
  Filled 2021-04-13: qty 5

## 2021-04-13 MED ORDER — HEPARIN SOD (PORK) LOCK FLUSH 100 UNIT/ML IV SOLN
500.0000 [IU] | Freq: Once | INTRAVENOUS | Status: AC | PRN
Start: 2021-04-13 — End: 2021-04-13
  Administered 2021-04-13: 500 [IU]
  Filled 2021-04-13: qty 5

## 2021-04-13 MED ORDER — SODIUM CHLORIDE 0.9 % IV SOLN
25.0000 mg/m2 | Freq: Once | INTRAVENOUS | Status: AC
  Administered 2021-04-13: 42 mg via INTRAVENOUS
  Filled 2021-04-13: qty 42

## 2021-04-13 MED ORDER — PALONOSETRON HCL INJECTION 0.25 MG/5ML
0.2500 mg | Freq: Once | INTRAVENOUS | Status: AC
  Administered 2021-04-13: 0.25 mg via INTRAVENOUS
  Filled 2021-04-13: qty 5

## 2021-04-13 MED ORDER — SODIUM CHLORIDE 0.9 % IV SOLN
800.0000 mg/m2 | Freq: Once | INTRAVENOUS | Status: AC
  Administered 2021-04-13: 1330 mg via INTRAVENOUS
  Filled 2021-04-13: qty 26.3

## 2021-04-13 MED ORDER — POTASSIUM CHLORIDE CRYS ER 20 MEQ PO TBCR: 20.0000 meq | EXTENDED_RELEASE_TABLET | Freq: Two times a day (BID) | ORAL | 0 refills | Status: DC

## 2021-04-13 MED ORDER — SODIUM CHLORIDE 0.9 % IV SOLN
Freq: Once | INTRAVENOUS | Status: DC
  Filled 2021-04-13: qty 250

## 2021-04-13 MED ORDER — SODIUM CHLORIDE 0.9 % IV SOLN
Freq: Once | INTRAVENOUS | Status: AC
  Filled 2021-04-13: qty 250

## 2021-04-13 MED ORDER — SODIUM CHLORIDE 0.9% FLUSH
10.0000 mL | INTRAVENOUS | Status: DC | PRN
  Administered 2021-04-13: 10 mL
  Filled 2021-04-13: qty 10

## 2021-04-13 MED ORDER — SODIUM CHLORIDE 0.9 % IV SOLN
10.0000 mg | Freq: Once | INTRAVENOUS | Status: AC
  Administered 2021-04-13: 10 mg via INTRAVENOUS
  Filled 2021-04-13: qty 1

## 2021-04-13 MED ORDER — MAGNESIUM SULFATE 2 GM/50ML IV SOLN
2.0000 g | Freq: Once | INTRAVENOUS | Status: AC
  Administered 2021-04-13: 2 g via INTRAVENOUS
  Filled 2021-04-13: qty 50

## 2021-04-13 MED ORDER — POTASSIUM CHLORIDE 2 MEQ/ML IV SOLN
Freq: Once | INTRAVENOUS | Status: AC
  Filled 2021-04-13: qty 1000

## 2021-04-13 NOTE — Telephone Encounter (Signed)
-----   Message from Ladell Pier, MD sent at 04/12/2021  5:11 PM EDT ----- Please call patient, potassium is low, start potassium chloride 20 mEq daily

## 2021-04-13 NOTE — Telephone Encounter (Signed)
Message relayed to Pt prescription for potassium sent to pharmacy.

## 2021-04-13 NOTE — Progress Notes (Signed)
Ok to proceed with Gemzar with 125 urine output. Notify Dr Benay Spice if patient does not put out 200 total before administering Cisplatin. Per Dr Benay Spice

## 2021-04-13 NOTE — Patient Instructions (Signed)
[facility]  Discharge Instructions: Thank you for choosing [facility] to provide your oncology and hematology care.   If you have a lab appointment with the [facility], please go directly to the [facility] and check in at the registration area.   Wear comfortable clothing and clothing appropriate for easy access to any Portacath or PICC line.   We strive to give you quality time with your provider. You may need to reschedule your appointment if you arrive late (15 or more minutes).  Arriving late affects you and other patients whose appointments are after yours.  Also, if you miss three or more appointments without notifying the office, you may be dismissed from the clinic at the provider's discretion.      For prescription refill requests, have your pharmacy contact our office and allow 72 hours for refills to be completed.    Today you received the following chemotherapy and/or immunotherapy agents Gemzar, Cisplatin   To help prevent nausea and vomiting after your treatment, we encourage you to take your nausea medication as directed.  BELOW ARE SYMPTOMS THAT SHOULD BE REPORTED IMMEDIATELY: *FEVER GREATER THAN 100.4 F (38 C) OR HIGHER *CHILLS OR SWEATING *NAUSEA AND VOMITING THAT IS NOT CONTROLLED WITH YOUR NAUSEA MEDICATION *UNUSUAL SHORTNESS OF BREATH *UNUSUAL BRUISING OR BLEEDING *URINARY PROBLEMS (pain or burning when urinating, or frequent urination) *BOWEL PROBLEMS (unusual diarrhea, constipation, pain near the anus) TENDERNESS IN MOUTH AND THROAT WITH OR WITHOUT PRESENCE OF ULCERS (sore throat, sores in mouth, or a toothache) UNUSUAL RASH, SWELLING OR PAIN  UNUSUAL VAGINAL DISCHARGE OR ITCHING   Items with * indicate a potential emergency and should be followed up as soon as possible or go to the Emergency Department if any problems should occur.  Please show the CHEMOTHERAPY ALERT CARD or IMMUNOTHERAPY ALERT CARD at check-in to  the Emergency Department and triage nurse.  Should you have questions after your visit or need to cancel or reschedule your appointment, please contact [facility]  Dept: [phone]  and follow the prompts.  Office hours are 8:00 a.m. to 4:30 p.m. Monday - Friday. Please note that voicemails left after 4:00 p.m. may not be returned until the following business day.  We are closed weekends and major holidays. You have access to a nurse at all times for urgent questions. Please call the main number to the clinic Dept: [phone] and follow the prompts.   For any non-urgent questions, you may also contact your provider using MyChart. We now offer e-Visits for anyone 32 and older to request care online for non-urgent symptoms. For details visit [email or web address].   Also download the MyChart app! Go to the app store, search "MyChart", open the app, select [facility], and log in with your MyChart username and password.  Due to Covid, a mask is required upon entering the hospital/clinic. If you do not have a mask, one will be given to you upon arrival. For doctor visits, patients may have 1 support person aged 27 or older with them. For treatment visits, patients cannot have anyone with them due to current Covid guidelines and our immunocompromised population.   Gemcitabine injection What is this medication? GEMCITABINE (jem SYE ta been) is a chemotherapy drug. This medicine is used to treat many types of cancer like breast cancer, lung cancer, pancreatic cancer,and ovarian cancer. This medicine may be used for other purposes; ask your health care provider orpharmacist if you have  questions. COMMON BRAND NAME(S): Gemzar, Infugem What should I tell my care team before I take this medication? They need to know if you have any of these conditions: blood disorders infection kidney disease liver disease lung or breathing disease, like asthma recent or ongoing  radiation therapy an unusual or allergic reaction to gemcitabine, other chemotherapy, other medicines, foods, dyes, or preservatives pregnant or trying to get pregnant breast-feeding How should I use this medication? This drug is given as an infusion into a vein. It is administered in a hospitalor clinic by a specially trained health care professional. Talk to your pediatrician regarding the use of this medicine in children.Special care may be needed. Overdosage: If you think you have taken too much of this medicine contact apoison control center or emergency room at once. NOTE: This medicine is only for you. Do not share this medicine with others. What if I miss a dose? It is important not to miss your dose. Call your doctor or health careprofessional if you are unable to keep an appointment. What may interact with this medication? medicines to increase blood counts like filgrastim, pegfilgrastim, sargramostim some other chemotherapy drugs like cisplatin vaccines Talk to your doctor or health care professional before taking any of thesemedicines: acetaminophen aspirin ibuprofen ketoprofen naproxen This list may not describe all possible interactions. Give your health care provider a list of all the medicines, herbs, non-prescription drugs, or dietary supplements you use. Also tell them if you smoke, drink alcohol, or use illegaldrugs. Some items may interact with your medicine. What should I watch for while using this medication? Visit your doctor for checks on your progress. This drug may make you feel generally unwell. This is not uncommon, as chemotherapy can affect healthy cells as well as cancer cells. Report any side effects. Continue your course oftreatment even though you feel ill unless your doctor tells you to stop. In some cases, you may be given additional medicines to help with side effects.Follow all directions for their use. Call your doctor or health care professional for  advice if you get a fever, chills or sore throat, or other symptoms of a cold or flu. Do not treat yourself. This drug decreases your body's ability to fight infections. Try toavoid being around people who are sick. This medicine may increase your risk to bruise or bleed. Call your doctor orhealth care professional if you notice any unusual bleeding. Be careful brushing and flossing your teeth or using a toothpick because you may get an infection or bleed more easily. If you have any dental work done,tell your dentist you are receiving this medicine. Avoid taking products that contain aspirin, acetaminophen, ibuprofen, naproxen, or ketoprofen unless instructed by your doctor. These medicines may hide afever. Do not become pregnant while taking this medicine or for 6 months after stopping it. Women should inform their doctor if they wish to become pregnant or think they might be pregnant. Men should not father a child while taking this medicine and for 3 months after stopping it. There is a potential for serious side effects to an unborn child. Talk to your health care professional or pharmacist for more information. Do not breast-feed an infant while takingthis medicine or for at least 1 week after stopping it. Men should inform their doctors if they wish to father a child. This medicine may lower sperm counts. Talk with your doctor or health care professional ifyou are concerned about your fertility. What side effects may I notice from receiving this medication?  Side effects that you should report to your doctor or health care professionalas soon as possible: allergic reactions like skin rash, itching or hives, swelling of the face, lips, or tongue breathing problems pain, redness, or irritation at site where injected signs and symptoms of a dangerous change in heartbeat or heart rhythm like chest pain; dizziness; fast or irregular heartbeat; palpitations; feeling faint or lightheaded, falls; breathing  problems signs of decreased platelets or bleeding - bruising, pinpoint red spots on the skin, black, tarry stools, blood in the urine signs of decreased red blood cells - unusually weak or tired, feeling faint or lightheaded, falls signs of infection - fever or chills, cough, sore throat, pain or difficulty passing urine signs and symptoms of kidney injury like trouble passing urine or change in the amount of urine signs and symptoms of liver injury like dark yellow or brown urine; general ill feeling or flu-like symptoms; light-colored stools; loss of appetite; nausea; right upper belly pain; unusually weak or tired; yellowing of the eyes or skin swelling of ankles, feet, hands Side effects that usually do not require medical attention (report to yourdoctor or health care professional if they continue or are bothersome): constipation diarrhea hair loss loss of appetite nausea rash vomiting This list may not describe all possible side effects. Call your doctor for medical advice about side effects. You may report side effects to FDA at1-800-FDA-1088. Where should I keep my medication? This drug is given in a hospital or clinic and will not be stored at home. NOTE: This sheet is a summary. It may not cover all possible information. If you have questions about this medicine, talk to your doctor, pharmacist, orhealth care provider.  2022 Elsevier/Gold Standard (2017-12-24 18:06:11)  Cisplatin injection What is this medication? CISPLATIN (SIS pla tin) is a chemotherapy drug. It targets fast dividing cells, like cancer cells, and causes these cells to die. This medicine is used totreat many types of cancer like bladder, ovarian, and testicular cancers. This medicine may be used for other purposes; ask your health care provider orpharmacist if you have questions. COMMON BRAND NAME(S): Platinol, Platinol -AQ What should I tell my care team before I take this medication? They need to know if you  have any of these conditions: eye disease, vision problems hearing problems kidney disease low blood counts, like white cells, platelets, or red blood cells tingling of the fingers or toes, or other nerve disorder an unusual or allergic reaction to cisplatin, carboplatin, oxaliplatin, other medicines, foods, dyes, or preservatives pregnant or trying to get pregnant breast-feeding How should I use this medication? This drug is given as an infusion into a vein. It is administered in a hospitalor clinic by a specially trained health care professional. Talk to your pediatrician regarding the use of this medicine in children.Special care may be needed. Overdosage: If you think you have taken too much of this medicine contact apoison control center or emergency room at once. NOTE: This medicine is only for you. Do not share this medicine with others. What if I miss a dose? It is important not to miss a dose. Call your doctor or health careprofessional if you are unable to keep an appointment. What may interact with this medication? This medicine may interact with the following medications: foscarnet certain antibiotics like amikacin, gentamicin, neomycin, polymyxin B, streptomycin, tobramycin, vancomycin This list may not describe all possible interactions. Give your health care provider a list of all the medicines, herbs, non-prescription drugs, or dietary supplements you  use. Also tell them if you smoke, drink alcohol, or use illegaldrugs. Some items may interact with your medicine. What should I watch for while using this medication? Your condition will be monitored carefully while you are receiving this medicine. You will need important blood work done while you are taking thismedicine. This drug may make you feel generally unwell. This is not uncommon, as chemotherapy can affect healthy cells as well as cancer cells. Report any side effects. Continue your course of treatment even though you  feel ill unless yourdoctor tells you to stop. This medicine may increase your risk of getting an infection. Call your healthcare professional for advice if you get a fever, chills, or sore throat, or other symptoms of a cold or flu. Do not treat yourself. Try to avoid beingaround people who are sick. Avoid taking medicines that contain aspirin, acetaminophen, ibuprofen, naproxen, or ketoprofen unless instructed by your healthcare professional.These medicines may hide a fever. This medicine may increase your risk to bruise or bleed. Call your doctor orhealth care professional if you notice any unusual bleeding. Be careful brushing and flossing your teeth or using a toothpick because you may get an infection or bleed more easily. If you have any dental work done,tell your dentist you are receiving this medicine. Do not become pregnant while taking this medicine or for 14 months after stopping it. Women should inform their healthcare professional if they wish to become pregnant or think they might be pregnant. Men should not father a child while taking this medicine and for 11 months after stopping it. There is potential for serious side effects to an unborn child. Talk to your healthcareprofessional for more information. Do not breast-feed an infant while taking this medicine. This medicine has caused ovarian failure in some women. This medicine may make it more difficult to get pregnant. Talk to your healthcare professional if [place] concerned about your fertility. This medicine has caused decreased sperm counts in some men. This may make it more difficult to father a child. Talk to your healthcare professional if [place] concerned about your fertility. Drink fluids as directed while you are taking this medicine. This will helpprotect your kidneys. Call your doctor or health care professional if you get diarrhea. Do not treatyourself. What side effects may I notice from receiving this medication? Side  effects that you should report to your doctor or health care professionalas soon as possible: allergic reactions like skin rash, itching or hives, swelling of the face, lips, or tongue blurred vision changes in vision decreased hearing or ringing of the ears nausea, vomiting pain, redness, or irritation at site where injected pain, tingling, numbness in the hands or feet signs and symptoms of bleeding such as bloody or black, tarry stools; red or dark brown urine; spitting up blood or brown material that looks like coffee grounds; red spots on the skin; unusual bruising or bleeding from the eyes, gums, or nose signs and symptoms of infection like fever; chills; cough; sore throat; pain or trouble passing urine signs and symptoms of kidney injury like trouble passing urine or change in the amount of urine signs and symptoms of low red blood cells or anemia such as unusually weak or tired; feeling faint or lightheaded; falls; breathing problems Side effects that usually do not require medical attention (report to yourdoctor or health care professional if they continue or are bothersome): loss of appetite mouth sores muscle cramps This list may not describe all possible side effects. Call your doctor for  medical advice about side effects. You may report side effects to FDA at1-800-FDA-1088. Where should I keep my medication? This drug is given in a hospital or clinic and will not be stored at home. NOTE: This sheet is a summary. It may not cover all possible information. If you have questions about this medicine, talk to your doctor, pharmacist, orhealth care provider.  2022 Elsevier/Gold Standard (2018-09-25 15:59:17)

## 2021-04-15 ENCOUNTER — Other Ambulatory Visit: Payer: Self-pay | Admitting: Oncology

## 2021-04-17 ENCOUNTER — Telehealth: Payer: Self-pay

## 2021-04-17 ENCOUNTER — Telehealth: Payer: Self-pay | Admitting: Emergency Medicine

## 2021-04-17 NOTE — Telephone Encounter (Signed)
TC from Pt's daughter stating Pt was exposed to Covid and wanted to know if she should come to her appointment on Thursday. Informed Pt's daughter that Pt should not come in to the appointment. Scheduling message left to reschedule Pt's appointments for next week.

## 2021-04-17 NOTE — Telephone Encounter (Signed)
24 hour call back. Pt did well had no sign or symptoms of side effects.

## 2021-04-18 ENCOUNTER — Inpatient Hospital Stay: Payer: Medicare Other

## 2021-04-19 ENCOUNTER — Other Ambulatory Visit: Payer: Self-pay

## 2021-04-19 ENCOUNTER — Inpatient Hospital Stay: Payer: Medicare Other

## 2021-04-19 ENCOUNTER — Telehealth: Payer: Self-pay

## 2021-04-19 ENCOUNTER — Other Ambulatory Visit: Payer: Self-pay | Admitting: Nurse Practitioner

## 2021-04-19 VITALS — BP 120/67 | HR 73 | Temp 98.3°F | Resp 18 | Ht 66.0 in | Wt 128.1 lb

## 2021-04-19 DIAGNOSIS — Z5111 Encounter for antineoplastic chemotherapy: Secondary | ICD-10-CM | POA: Diagnosis not present

## 2021-04-19 DIAGNOSIS — C221 Intrahepatic bile duct carcinoma: Secondary | ICD-10-CM

## 2021-04-19 LAB — CBC WITH DIFFERENTIAL (CANCER CENTER ONLY)
Abs Immature Granulocytes: 0.06 10*3/uL (ref 0.00–0.07)
Basophils Absolute: 0.1 10*3/uL (ref 0.0–0.1)
Basophils Relative: 1 %
Eosinophils Absolute: 0.1 10*3/uL (ref 0.0–0.5)
Eosinophils Relative: 1 %
HCT: 37 % (ref 36.0–46.0)
Hemoglobin: 12.1 g/dL (ref 12.0–15.0)
Immature Granulocytes: 1 %
Lymphocytes Relative: 18 %
Lymphs Abs: 0.9 10*3/uL (ref 0.7–4.0)
MCH: 30.9 pg (ref 26.0–34.0)
MCHC: 32.7 g/dL (ref 30.0–36.0)
MCV: 94.6 fL (ref 80.0–100.0)
Monocytes Absolute: 0.3 10*3/uL (ref 0.1–1.0)
Monocytes Relative: 6 %
Neutro Abs: 3.5 10*3/uL (ref 1.7–7.7)
Neutrophils Relative %: 73 %
Platelet Count: 196 10*3/uL (ref 150–400)
RBC: 3.91 MIL/uL (ref 3.87–5.11)
RDW: 13.4 % (ref 11.5–15.5)
WBC Count: 4.8 10*3/uL (ref 4.0–10.5)
nRBC: 0 % (ref 0.0–0.2)

## 2021-04-19 LAB — CMP (CANCER CENTER ONLY)
ALT: 47 U/L — ABNORMAL HIGH (ref 0–44)
AST: 58 U/L — ABNORMAL HIGH (ref 15–41)
Albumin: 3.8 g/dL (ref 3.5–5.0)
Alkaline Phosphatase: 476 U/L — ABNORMAL HIGH (ref 38–126)
Anion gap: 8 (ref 5–15)
BUN: 17 mg/dL (ref 8–23)
CO2: 29 mmol/L (ref 22–32)
Calcium: 10.6 mg/dL — ABNORMAL HIGH (ref 8.9–10.3)
Chloride: 97 mmol/L — ABNORMAL LOW (ref 98–111)
Creatinine: 0.66 mg/dL (ref 0.44–1.00)
GFR, Estimated: >60 mL/min (ref >60)
Glucose, Bld: 107 mg/dL — ABNORMAL HIGH (ref 70–99)
Potassium: 4.1 mmol/L (ref 3.5–5.1)
Sodium: 134 mmol/L — ABNORMAL LOW (ref 135–145)
Total Bilirubin: 1 mg/dL (ref 0.3–1.2)
Total Protein: 6.5 g/dL (ref 6.5–8.1)

## 2021-04-19 LAB — MAGNESIUM: Magnesium: 1.6 mg/dL — ABNORMAL LOW (ref 1.7–2.4)

## 2021-04-19 MED ORDER — SODIUM CHLORIDE 0.9% FLUSH
10.0000 mL | INTRAVENOUS | Status: DC | PRN
  Administered 2021-04-19: 10 mL
  Filled 2021-04-19: qty 10

## 2021-04-19 MED ORDER — PALONOSETRON HCL INJECTION 0.25 MG/5ML
0.2500 mg | Freq: Once | INTRAVENOUS | Status: AC
Start: 2021-04-19 — End: 2021-04-19
  Administered 2021-04-19: 0.25 mg via INTRAVENOUS
  Filled 2021-04-19: qty 5

## 2021-04-19 MED ORDER — SODIUM CHLORIDE 0.9 % IV SOLN
10.0000 mg | Freq: Once | INTRAVENOUS | Status: AC
  Administered 2021-04-19: 10 mg via INTRAVENOUS
  Filled 2021-04-19: qty 1

## 2021-04-19 MED ORDER — MAGNESIUM SULFATE 2 GM/50ML IV SOLN
2.0000 g | Freq: Once | INTRAVENOUS | Status: AC
  Administered 2021-04-19: 2 g via INTRAVENOUS
  Filled 2021-04-19: qty 50

## 2021-04-19 MED ORDER — HEPARIN SOD (PORK) LOCK FLUSH 100 UNIT/ML IV SOLN
500.0000 [IU] | Freq: Once | INTRAVENOUS | Status: AC | PRN
  Administered 2021-04-19: 500 [IU]
  Filled 2021-04-19: qty 5

## 2021-04-19 MED ORDER — SODIUM CHLORIDE 0.9 % IV SOLN
Freq: Once | INTRAVENOUS | Status: AC
Start: 2021-04-19 — End: 2021-04-19
  Filled 2021-04-19: qty 1000

## 2021-04-19 MED ORDER — SODIUM CHLORIDE 0.9 % IV SOLN
Freq: Once | INTRAVENOUS | Status: AC
  Filled 2021-04-19: qty 250

## 2021-04-19 MED ORDER — SODIUM CHLORIDE 0.9 % IV SOLN
150.0000 mg | Freq: Once | INTRAVENOUS | Status: AC
  Administered 2021-04-19: 150 mg via INTRAVENOUS
  Filled 2021-04-19: qty 5

## 2021-04-19 MED ORDER — SODIUM CHLORIDE 0.9 % IV SOLN
25.0000 mg/m2 | Freq: Once | INTRAVENOUS | Status: AC
  Administered 2021-04-19: 42 mg via INTRAVENOUS
  Filled 2021-04-19: qty 42

## 2021-04-19 MED ORDER — SODIUM CHLORIDE 0.9 % IV SOLN
800.0000 mg/m2 | Freq: Once | INTRAVENOUS | Status: AC
  Administered 2021-04-19: 1330 mg via INTRAVENOUS
  Filled 2021-04-19: qty 10.52

## 2021-04-19 NOTE — Telephone Encounter (Signed)
Per Dr Benay Spice Pt will not receive GCSF injection tomorrow. Appointment canceled. Pt notified.

## 2021-04-19 NOTE — Patient Instructions (Signed)
[name]   Discharge Instructions: Thank you for choosing [facility] to provide your oncology and hematology care.   If you have a lab appointment with the [facility], please go directly to the [facility] and check in at the registration area.   Wear comfortable clothing and clothing appropriate for easy access to any Portacath or PICC line.   We strive to give you quality time with your provider. You may need to reschedule your appointment if you arrive late (15 or more minutes).  Arriving late affects you and other patients whose appointments are after yours.  Also, if you miss three or more appointments without notifying the office, you may be dismissed from the clinic at the provider's discretion.      For prescription refill requests, have your pharmacy contact our office and allow 72 hours for refills to be completed.    Today you received the following chemotherapy and/or immunotherapy agents Gemcitabine (GEMZAR) & Cisplatin (PLATINOL).      To help prevent nausea and vomiting after your treatment, we encourage you to take your nausea medication as directed.  BELOW ARE SYMPTOMS THAT SHOULD BE REPORTED IMMEDIATELY: *FEVER GREATER THAN 100.4 F (38 C) OR HIGHER *CHILLS OR SWEATING *NAUSEA AND VOMITING THAT IS NOT CONTROLLED WITH YOUR NAUSEA MEDICATION *UNUSUAL SHORTNESS OF BREATH *UNUSUAL BRUISING OR BLEEDING *URINARY PROBLEMS (pain or burning when urinating, or frequent urination) *BOWEL PROBLEMS (unusual diarrhea, constipation, pain near the anus) TENDERNESS IN MOUTH AND THROAT WITH OR WITHOUT PRESENCE OF ULCERS (sore throat, sores in mouth, or a toothache) UNUSUAL RASH, SWELLING OR PAIN  UNUSUAL VAGINAL DISCHARGE OR ITCHING   Items with * indicate a potential emergency and should be followed up as soon as possible or go to the Emergency Department if any problems should occur.  Please show the CHEMOTHERAPY ALERT CARD or  IMMUNOTHERAPY ALERT CARD at check-in to the Emergency Department and triage nurse.  Should you have questions after your visit or need to cancel or reschedule your appointment, please contact [facility]  Dept: [phone]  and follow the prompts.  Office hours are 8:00 a.m. to 4:30 p.m. Monday - Friday. Please note that voicemails left after 4:00 p.m. may not be returned until the following business day.  We are closed weekends and major holidays. You have access to a nurse at all times for urgent questions. Please call the main number to the clinic Dept: [phone] and follow the prompts.   For any non-urgent questions, you may also contact your provider using MyChart. We now offer e-Visits for anyone 97 and older to request care online for non-urgent symptoms. For details visit [email or web address].   Also download the MyChart app! Go to the app store, search "MyChart", open the app, select [facility], and log in with your MyChart username and password.  Due to Covid, a mask is required upon entering the hospital/clinic. If you do not have a mask, one will be given to you upon arrival. For doctor visits, patients may have 1 support person aged 77 or older with them. For treatment visits, patients cannot have anyone with them due to current Covid guidelines and our immunocompromised population.   Gemcitabine injection What is this medication? GEMCITABINE (jem SYE ta been) is a chemotherapy drug. This medicine is used to treat many types of cancer like breast cancer, lung cancer, pancreatic cancer,and ovarian cancer. This medicine may be used for other purposes; ask your  health care provider orpharmacist if you have questions. COMMON BRAND NAME(S): Gemzar, Infugem What should I tell my care team before I take this medication? They need to know if you have any of these conditions: blood disorders infection kidney disease liver disease lung or breathing disease,  like asthma recent or ongoing radiation therapy an unusual or allergic reaction to gemcitabine, other chemotherapy, other medicines, foods, dyes, or preservatives pregnant or trying to get pregnant breast-feeding How should I use this medication? This drug is given as an infusion into a vein. It is administered in a hospitalor clinic by a specially trained health care professional. Talk to your pediatrician regarding the use of this medicine in children.Special care may be needed. Overdosage: If you think you have taken too much of this medicine contact apoison control center or emergency room at once. NOTE: This medicine is only for you. Do not share this medicine with others. What if I miss a dose? It is important not to miss your dose. Call your doctor or health careprofessional if you are unable to keep an appointment. What may interact with this medication? medicines to increase blood counts like filgrastim, pegfilgrastim, sargramostim some other chemotherapy drugs like cisplatin vaccines Talk to your doctor or health care professional before taking any of thesemedicines: acetaminophen aspirin ibuprofen ketoprofen naproxen This list may not describe all possible interactions. Give your health care provider a list of all the medicines, herbs, non-prescription drugs, or dietary supplements you use. Also tell them if you smoke, drink alcohol, or use illegaldrugs. Some items may interact with your medicine. What should I watch for while using this medication? Visit your doctor for checks on your progress. This drug may make you feel generally unwell. This is not uncommon, as chemotherapy can affect healthy cells as well as cancer cells. Report any side effects. Continue your course oftreatment even though you feel ill unless your doctor tells you to stop. In some cases, you may be given additional medicines to help with side effects.Follow all directions for their use. Call your doctor or  health care professional for advice if you get a fever, chills or sore throat, or other symptoms of a cold or flu. Do not treat yourself. This drug decreases your body's ability to fight infections. Try toavoid being around people who are sick. This medicine may increase your risk to bruise or bleed. Call your doctor orhealth care professional if you notice any unusual bleeding. Be careful brushing and flossing your teeth or using a toothpick because you may get an infection or bleed more easily. If you have any dental work done,tell your dentist you are receiving this medicine. Avoid taking products that contain aspirin, acetaminophen, ibuprofen, naproxen, or ketoprofen unless instructed by your doctor. These medicines may hide afever. Do not become pregnant while taking this medicine or for 6 months after stopping it. Women should inform their doctor if they wish to become pregnant or think they might be pregnant. Men should not father a child while taking this medicine and for 3 months after stopping it. There is a potential for serious side effects to an unborn child. Talk to your health care professional or pharmacist for more information. Do not breast-feed an infant while takingthis medicine or for at least 1 week after stopping it. Men should inform their doctors if they wish to father a child. This medicine may lower sperm counts. Talk with your doctor or health care professional ifyou are concerned about your fertility. What side effects  may I notice from receiving this medication? Side effects that you should report to your doctor or health care professionalas soon as possible: allergic reactions like skin rash, itching or hives, swelling of the face, lips, or tongue breathing problems pain, redness, or irritation at site where injected signs and symptoms of a dangerous change in heartbeat or heart rhythm like chest pain; dizziness; fast or irregular heartbeat; palpitations; feeling faint or  lightheaded, falls; breathing problems signs of decreased platelets or bleeding - bruising, pinpoint red spots on the skin, black, tarry stools, blood in the urine signs of decreased red blood cells - unusually weak or tired, feeling faint or lightheaded, falls signs of infection - fever or chills, cough, sore throat, pain or difficulty passing urine signs and symptoms of kidney injury like trouble passing urine or change in the amount of urine signs and symptoms of liver injury like dark yellow or brown urine; general ill feeling or flu-like symptoms; light-colored stools; loss of appetite; nausea; right upper belly pain; unusually weak or tired; yellowing of the eyes or skin swelling of ankles, feet, hands Side effects that usually do not require medical attention (report to yourdoctor or health care professional if they continue or are bothersome): constipation diarrhea hair loss loss of appetite nausea rash vomiting This list may not describe all possible side effects. Call your doctor for medical advice about side effects. You may report side effects to FDA at1-800-FDA-1088. Where should I keep my medication? This drug is given in a hospital or clinic and will not be stored at home. NOTE: This sheet is a summary. It may not cover all possible information. If you have questions about this medicine, talk to your doctor, pharmacist, orhealth care provider.  2022 Elsevier/Gold Standard (2017-12-24 18:06:11)  Cisplatin injection What is this medication? CISPLATIN (SIS pla tin) is a chemotherapy drug. It targets fast dividing cells, like cancer cells, and causes these cells to die. This medicine is used totreat many types of cancer like bladder, ovarian, and testicular cancers. This medicine may be used for other purposes; ask your health care provider orpharmacist if you have questions. COMMON BRAND NAME(S): Platinol, Platinol -AQ What should I tell my care team before I take this  medication? They need to know if you have any of these conditions: eye disease, vision problems hearing problems kidney disease low blood counts, like white cells, platelets, or red blood cells tingling of the fingers or toes, or other nerve disorder an unusual or allergic reaction to cisplatin, carboplatin, oxaliplatin, other medicines, foods, dyes, or preservatives pregnant or trying to get pregnant breast-feeding How should I use this medication? This drug is given as an infusion into a vein. It is administered in a hospitalor clinic by a specially trained health care professional. Talk to your pediatrician regarding the use of this medicine in children.Special care may be needed. Overdosage: If you think you have taken too much of this medicine contact apoison control center or emergency room at once. NOTE: This medicine is only for you. Do not share this medicine with others. What if I miss a dose? It is important not to miss a dose. Call your doctor or health careprofessional if you are unable to keep an appointment. What may interact with this medication? This medicine may interact with the following medications: foscarnet certain antibiotics like amikacin, gentamicin, neomycin, polymyxin B, streptomycin, tobramycin, vancomycin This list may not describe all possible interactions. Give your health care provider a list of all the medicines,  herbs, non-prescription drugs, or dietary supplements you use. Also tell them if you smoke, drink alcohol, or use illegaldrugs. Some items may interact with your medicine. What should I watch for while using this medication? Your condition will be monitored carefully while you are receiving this medicine. You will need important blood work done while you are taking thismedicine. This drug may make you feel generally unwell. This is not uncommon, as chemotherapy can affect healthy cells as well as cancer cells. Report any side effects. Continue your  course of treatment even though you feel ill unless yourdoctor tells you to stop. This medicine may increase your risk of getting an infection. Call your healthcare professional for advice if you get a fever, chills, or sore throat, or other symptoms of a cold or flu. Do not treat yourself. Try to avoid beingaround people who are sick. Avoid taking medicines that contain aspirin, acetaminophen, ibuprofen, naproxen, or ketoprofen unless instructed by your healthcare professional.These medicines may hide a fever. This medicine may increase your risk to bruise or bleed. Call your doctor orhealth care professional if you notice any unusual bleeding. Be careful brushing and flossing your teeth or using a toothpick because you may get an infection or bleed more easily. If you have any dental work done,tell your dentist you are receiving this medicine. Do not become pregnant while taking this medicine or for 14 months after stopping it. Women should inform their healthcare professional if they wish to become pregnant or think they might be pregnant. Men should not father a child while taking this medicine and for 11 months after stopping it. There is potential for serious side effects to an unborn child. Talk to your healthcareprofessional for more information. Do not breast-feed an infant while taking this medicine. This medicine has caused ovarian failure in some women. This medicine may make it more difficult to get pregnant. Talk to your healthcare professional if [place] concerned about your fertility. This medicine has caused decreased sperm counts in some men. This may make it more difficult to father a child. Talk to your healthcare professional if [place] concerned about your fertility. Drink fluids as directed while you are taking this medicine. This will helpprotect your kidneys. Call your doctor or health care professional if you get diarrhea. Do not treatyourself. What side effects may I notice from  receiving this medication? Side effects that you should report to your doctor or health care professionalas soon as possible: allergic reactions like skin rash, itching or hives, swelling of the face, lips, or tongue blurred vision changes in vision decreased hearing or ringing of the ears nausea, vomiting pain, redness, or irritation at site where injected pain, tingling, numbness in the hands or feet signs and symptoms of bleeding such as bloody or black, tarry stools; red or dark brown urine; spitting up blood or brown material that looks like coffee grounds; red spots on the skin; unusual bruising or bleeding from the eyes, gums, or nose signs and symptoms of infection like fever; chills; cough; sore throat; pain or trouble passing urine signs and symptoms of kidney injury like trouble passing urine or change in the amount of urine signs and symptoms of low red blood cells or anemia such as unusually weak or tired; feeling faint or lightheaded; falls; breathing problems Side effects that usually do not require medical attention (report to yourdoctor or health care professional if they continue or are bothersome): loss of appetite mouth sores muscle cramps This list may not describe all  possible side effects. Call your doctor for medical advice about side effects. You may report side effects to FDA at1-800-FDA-1088. Where should I keep my medication? This drug is given in a hospital or clinic and will not be stored at home. NOTE: This sheet is a summary. It may not cover all possible information. If you have questions about this medicine, talk to your doctor, pharmacist, orhealth care provider.  2022 Elsevier/Gold Standard (2018-09-25 15:59:17)

## 2021-04-20 ENCOUNTER — Inpatient Hospital Stay: Payer: Medicare Other

## 2021-04-25 ENCOUNTER — Inpatient Hospital Stay: Payer: Medicare Other | Admitting: Dietician

## 2021-04-26 ENCOUNTER — Inpatient Hospital Stay (HOSPITAL_BASED_OUTPATIENT_CLINIC_OR_DEPARTMENT_OTHER): Payer: Medicare Other | Admitting: Oncology

## 2021-04-26 ENCOUNTER — Encounter: Payer: Self-pay | Admitting: Oncology

## 2021-04-26 ENCOUNTER — Other Ambulatory Visit: Payer: Self-pay

## 2021-04-26 VITALS — BP 121/54 | HR 83 | Temp 97.5°F | Resp 18 | Wt 123.0 lb

## 2021-04-26 DIAGNOSIS — C221 Intrahepatic bile duct carcinoma: Secondary | ICD-10-CM | POA: Diagnosis not present

## 2021-04-26 DIAGNOSIS — Z5111 Encounter for antineoplastic chemotherapy: Secondary | ICD-10-CM | POA: Diagnosis not present

## 2021-04-26 NOTE — Progress Notes (Signed)
  [facility] OFFICE PROGRESS NOTE   Diagnosis: Cholangiocarcinoma  INTERVAL HISTORY:   [name] completed cycle 1 gemcitabine/cisplatin on 04/13/2021.  She completed day 8 therapy on 04/19/2021.  No nausea/vomiting or neuropathy symptoms.  She complains of increased malaise.  Abdominal bloating has improved.  She is here today with her daughter.  She is not as active and is staying in the bed part of the day.  Objective:  Vital signs in last 24 hours:  Blood pressure (!) 121/54, pulse 83, temperature (!) 97.5 F (36.4 C), temperature source Temporal, resp. rate 18, weight 133 lb (60.3 kg), SpO2 100 %.    HEENT: No thrush or ulcers Resp: Lungs clear bilaterally Cardio: Regular rate and rhythm GI: The liver edge is palpable in the right upper abdomen, no tenderness Vascular: No leg edema  Portacath/PICC-without erythema  Lab Results:  Lab Results  Component Value Date   WBC 4.8 04/19/2021   HGB 12.1 04/19/2021   HCT 37.0 04/19/2021   MCV 94.6 04/19/2021   PLT 196 04/19/2021   NEUTROABS 3.5 04/19/2021    CMP  Lab Results  Component Value Date   NA 134 (L) 04/19/2021   K 4.1 04/19/2021   CL 97 (L) 04/19/2021   CO2 29 04/19/2021   GLUCOSE 107 (H) 04/19/2021   BUN 17 04/19/2021   CREATININE 0.66 04/19/2021   CALCIUM 10.6 (H) 04/19/2021   PROT 6.5 04/19/2021   ALBUMIN 3.8 04/19/2021   AST 58 (H) 04/19/2021   ALT 47 (H) 04/19/2021   ALKPHOS 476 (H) 04/19/2021   BILITOT 1.0 04/19/2021   GFRNONAA >60 04/19/2021   GFRAA 64 (L) 11/03/2012     Medications: I have reviewed the patient's current medications.   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 CT abdomen/pelvis 12/21/2020-large right liver mass with peripheral intrahepatic biliary duct dilatation in the right liver, expansile tumor thrombus occluding the main, left, and right portal veins, mild porta hepatis lymphadenopathy, indeterminate 2 cm  hypodense right renal lesion, small volume ascites MRI abdomen 12/30/2020-9.4 x 6.8 cm liver mass centered in segment 5 with involvement of segments 4A and 8, peripheral intrahepatic biliary ductal dilatation, expansile occlusive tumor thrombus in the main, left, right portal veins, mass characterized as LR-M, solitary 0.9 cm segment 2 lesion suspicious for metastasis, mildly enlarged portacaval node, small volume ascites, right renal cortical mass-renal cell carcinoma not excluded Ultrasound-guided biopsy of right liver mass on 01/09/2021-pathology refer to [facility]- carcinoma with predominantly glandular and small foci of hepatocellular differentiation cholangiocarcinoma favored Cycle 1 gemcitabine/cisplatin 04/13/2021, day 8 04/19/2021 Cycle 2 gemcitabine/cisplatin 05/02/2021, treatment changed to an every 2-week schedule  Addison's disease Anorexia/weight loss secondary to #1 Family history of ovarian cancer Hypothyroidism Intermittent diarrhea-related to the right liver mass? Report of intermittent small-volume rectal bleeding        Disposition: [name] has completed 1 cycle of gemcitabine/cisplatin.  The chemotherapy has been complicated by increased malaise.  The malaise may be related to chemotherapy or disease progression.  She will return on 05/02/2021 for cycle 2 chemotherapy.  The chemotherapy will be changed to every 2-week dosing.  The plan is to complete 5 or 6 treatments with gemcitabine/cisplatin prior to a restaging CT evaluation.  Ms. Klutz will be seen for an office visit on 05/15/2021.  Betsy Coder, MD  04/26/2021  9:13 AM

## 2021-04-30 ENCOUNTER — Telehealth: Payer: Self-pay

## 2021-04-30 ENCOUNTER — Telehealth: Payer: Self-pay | Admitting: *Deleted

## 2021-04-30 NOTE — Telephone Encounter (Signed)
Duaghter would like to speak to you about physical effects of treatment- maybe stopping treatment.  Nest treatment is Wednesday July 20

## 2021-04-30 NOTE — Telephone Encounter (Signed)
Called daughter re: chemotherapy side effects. She is extremely fatigued and lying around all the time. Not eating well. Asking about stopping chemo for awhile, or change schedule again. She agrees to wait to discuss all this at visit on 05/02/21.

## 2021-05-01 ENCOUNTER — Inpatient Hospital Stay: Payer: Medicare Other

## 2021-05-01 ENCOUNTER — Encounter: Payer: Medicare Other | Admitting: Dietician

## 2021-05-01 ENCOUNTER — Inpatient Hospital Stay: Payer: Medicare Other | Admitting: Oncology

## 2021-05-02 ENCOUNTER — Inpatient Hospital Stay: Payer: Medicare Other

## 2021-05-02 ENCOUNTER — Inpatient Hospital Stay (HOSPITAL_BASED_OUTPATIENT_CLINIC_OR_DEPARTMENT_OTHER): Payer: Medicare Other | Admitting: Nurse Practitioner

## 2021-05-02 ENCOUNTER — Other Ambulatory Visit: Payer: Self-pay

## 2021-05-02 ENCOUNTER — Inpatient Hospital Stay: Payer: Medicare Other | Admitting: Nutrition

## 2021-05-02 ENCOUNTER — Encounter: Payer: Self-pay | Admitting: Nurse Practitioner

## 2021-05-02 VITALS — BP 121/54 | HR 73 | Temp 97.6°F | Resp 20 | Ht 66.0 in | Wt 123.0 lb

## 2021-05-02 DIAGNOSIS — Z5111 Encounter for antineoplastic chemotherapy: Secondary | ICD-10-CM | POA: Diagnosis not present

## 2021-05-02 DIAGNOSIS — Z95828 Presence of other vascular implants and grafts: Secondary | ICD-10-CM

## 2021-05-02 DIAGNOSIS — C221 Intrahepatic bile duct carcinoma: Secondary | ICD-10-CM

## 2021-05-02 LAB — CBC WITH DIFFERENTIAL (CANCER CENTER ONLY)
Abs Immature Granulocytes: 0.09 10*3/uL — ABNORMAL HIGH (ref 0.00–0.07)
Basophils Absolute: 0.1 10*3/uL (ref 0.0–0.1)
Basophils Relative: 1 %
Eosinophils Absolute: 0 10*3/uL (ref 0.0–0.5)
Eosinophils Relative: 0 %
HCT: 35.6 % — ABNORMAL LOW (ref 36.0–46.0)
Hemoglobin: 11.6 g/dL — ABNORMAL LOW (ref 12.0–15.0)
Immature Granulocytes: 1 %
Lymphocytes Relative: 14 %
Lymphs Abs: 1.1 10*3/uL (ref 0.7–4.0)
MCH: 30.9 pg (ref 26.0–34.0)
MCHC: 32.6 g/dL (ref 30.0–36.0)
MCV: 94.9 fL (ref 80.0–100.0)
Monocytes Absolute: 0.8 10*3/uL (ref 0.1–1.0)
Monocytes Relative: 10 %
Neutro Abs: 5.8 10*3/uL (ref 1.7–7.7)
Neutrophils Relative %: 74 %
Platelet Count: 577 10*3/uL — ABNORMAL HIGH (ref 150–400)
RBC: 3.75 MIL/uL — ABNORMAL LOW (ref 3.87–5.11)
RDW: 14.4 % (ref 11.5–15.5)
WBC Count: 7.9 10*3/uL (ref 4.0–10.5)
nRBC: 0 % (ref 0.0–0.2)

## 2021-05-02 LAB — CMP (CANCER CENTER ONLY)
ALT: 31 U/L (ref 0–44)
AST: 52 U/L — ABNORMAL HIGH (ref 15–41)
Albumin: 3.8 g/dL (ref 3.5–5.0)
Alkaline Phosphatase: 443 U/L — ABNORMAL HIGH (ref 38–126)
Anion gap: 7 (ref 5–15)
BUN: 13 mg/dL (ref 8–23)
CO2: 28 mmol/L (ref 22–32)
Calcium: 9.8 mg/dL (ref 8.9–10.3)
Chloride: 99 mmol/L (ref 98–111)
Creatinine: 0.65 mg/dL (ref 0.44–1.00)
GFR, Estimated: >60 mL/min (ref >60)
Glucose, Bld: 105 mg/dL — ABNORMAL HIGH (ref 70–99)
Potassium: 3.8 mmol/L (ref 3.5–5.1)
Sodium: 134 mmol/L — ABNORMAL LOW (ref 135–145)
Total Bilirubin: 0.6 mg/dL (ref 0.3–1.2)
Total Protein: 6.4 g/dL — ABNORMAL LOW (ref 6.5–8.1)

## 2021-05-02 LAB — MAGNESIUM: Magnesium: 1.5 mg/dL — ABNORMAL LOW (ref 1.7–2.4)

## 2021-05-02 MED ORDER — PROCHLORPERAZINE MALEATE 10 MG PO TABS
10.0000 mg | ORAL_TABLET | Freq: Once | ORAL | Status: AC
  Administered 2021-05-02: 10 mg via ORAL

## 2021-05-02 MED ORDER — HEPARIN SOD (PORK) LOCK FLUSH 100 UNIT/ML IV SOLN
500.0000 [IU] | Freq: Once | INTRAVENOUS | Status: AC
  Administered 2021-05-02: 500 [IU] via INTRAVENOUS
  Filled 2021-05-02: qty 5

## 2021-05-02 MED ORDER — MAGNESIUM SULFATE 2 GM/50ML IV SOLN
2.0000 g | Freq: Once | INTRAVENOUS | Status: AC
  Administered 2021-05-02: 2 g via INTRAVENOUS

## 2021-05-02 MED ORDER — SODIUM CHLORIDE 0.9 % IV SOLN
Freq: Once | INTRAVENOUS | Status: AC
  Filled 2021-05-02: qty 250

## 2021-05-02 MED ORDER — SODIUM CHLORIDE 0.9 % IV SOLN
800.0000 mg/m2 | Freq: Once | INTRAVENOUS | Status: AC
  Administered 2021-05-02: 1292 mg via INTRAVENOUS
  Filled 2021-05-02: qty 26.3

## 2021-05-02 NOTE — Progress Notes (Signed)
[facility] OFFICE PROGRESS NOTE   Diagnosis:  Cholangiocarcinoma  INTERVAL HISTORY:   [name] is seen prior to scheduled follow-up. She has completed 1 cycle of gemcitabine/cisplatin. She developed increased malaise. Unclear if the malaise was related to chemotherapy or disease progression. Chemotherapy adjusted to a 2 week dosing schedule, with cycle 2 planned today.   She reports continued severe fatigue/malaise up until this morning.  At present she feels well.  She thinks the decline in her energy level is due to the chemotherapy she has received.  She denies nausea/vomiting.  No mouth sores.  No diarrhea.  No fever or rash following previous chemotherapy.  She denies pain.  She has a poor appetite citing alteration in taste.  Objective:  Vital signs in last 24 hours:  Blood pressure (!) 121/54, pulse 73, temperature 97.6 F (36.4 C), temperature source Oral, resp. rate 20, height 5\' 6"  (1.676 m), weight 123 lb (55.8 kg), SpO2 100 %.    HEENT: No thrush or ulcers. Resp: Lungs clear bilaterally. Cardio: Regular rate and rhythm. GI: Abdomen soft.  Liver palpable right upper abdomen, associated tenderness. Vascular: No leg edema. Neuro: Alert and oriented. Skin: No rash. Port-A-Cath without erythema.   Lab Results:  Lab Results  Component Value Date   WBC 7.9 05/02/2021   HGB 11.6 (L) 05/02/2021   HCT 35.6 (L) 05/02/2021   MCV 94.9 05/02/2021   PLT 577 (H) 05/02/2021   NEUTROABS 5.8 05/02/2021    Imaging:  No results found.  Medications: I have reviewed the patient's current medications.  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 CT abdomen/pelvis 12/21/2020-large right liver mass with peripheral intrahepatic biliary duct dilatation in the right liver, expansile tumor thrombus occluding the main, left, and right portal veins, mild porta hepatis lymphadenopathy, indeterminate 2 cm hypodense right  renal lesion, small volume ascites MRI abdomen 12/30/2020-9.4 x 6.8 cm liver mass centered in segment 5 with involvement of segments 4A and 8, peripheral intrahepatic biliary ductal dilatation, expansile occlusive tumor thrombus in the main, left, right portal veins, mass characterized as LR-M, solitary 0.9 cm segment 2 lesion suspicious for metastasis, mildly enlarged portacaval node, small volume ascites, right renal cortical mass-renal cell carcinoma not excluded Ultrasound-guided biopsy of right liver mass on 01/09/2021-pathology refer to [facility]- carcinoma with predominantly glandular and small foci of hepatocellular differentiation cholangiocarcinoma favored Cycle 1 gemcitabine/cisplatin 04/13/2021, day 8 04/19/2021 Cycle 2 gemcitabine/cisplatin 05/02/2021, treatment changed to an every 2-week schedule Gemcitabine alone 05/02/2021   Addison's disease Anorexia/weight loss secondary to #1 Family history of ovarian cancer Hypothyroidism Intermittent diarrhea-related to the right liver mass? Report of intermittent small-volume rectal bleeding Severe fatigue/malaise, question related to cisplatin/chemotherapy versus cancer  Disposition: [name] appears stable.  She has completed 1 cycle of gemcitabine/cisplatin.  She developed severe fatigue/malaise.  Treatment subsequently adjusted to every 2 weeks.  She was scheduled to receive gemcitabine/cisplatin today.  She is most interested in preserving her quality of life and is considering discontinuing chemotherapy completely.  We discussed trying gemcitabine alone today.  We also discussed supportive/comfort care with a hospice referral.  She would like to try gemcitabine alone.  We reviewed the CBC from today.  Counts adequate to proceed as above.  She will return for lab, follow-up, chemotherapy in 2 weeks.  She will contact the office in the interim with any problems.  Patient seen with Dr. Benay Spice.   Ned Card ANP/GNP-BC   05/02/2021  8:31  AM This was a  shared visit with Ned Card.  [name] has developed significant asthenia following gemcitabine/cisplatin.  We discussed treatment options including continuation of gemcitabine/cisplatin on a weekly schedule, proceeding with single agent gemcitabine, and discontinuing chemotherapy.  She would like to proceed with a trial of single agent gemcitabine.  Her daughter was present for today's visit.  I was present for greater than 50% of today's visit.  I performed medical decision making.  Julieanne Manson, MD

## 2021-05-02 NOTE — Patient Instructions (Signed)
[name]  Discharge Instructions: Thank you for choosing [facility] to provide your oncology and hematology care.   If you have a lab appointment with the [facility], please go directly to the [facility] and check in at the registration area.   Wear comfortable clothing and clothing appropriate for easy access to any Portacath or PICC line.   We strive to give you quality time with your provider. You may need to reschedule your appointment if you arrive late (15 or more minutes).  Arriving late affects you and other patients whose appointments are after yours.  Also, if you miss three or more appointments without notifying the office, you may be dismissed from the clinic at the provider's discretion.      For prescription refill requests, have your pharmacy contact our office and allow 72 hours for refills to be completed.    Today you received the following chemotherapy and/or immunotherapy agents GEMZAR      To help prevent nausea and vomiting after your treatment, we encourage you to take your nausea medication as directed.  BELOW ARE SYMPTOMS THAT SHOULD BE REPORTED IMMEDIATELY: *FEVER GREATER THAN 100.4 F (38 C) OR HIGHER *CHILLS OR SWEATING *NAUSEA AND VOMITING THAT IS NOT CONTROLLED WITH YOUR NAUSEA MEDICATION *UNUSUAL SHORTNESS OF BREATH *UNUSUAL BRUISING OR BLEEDING *URINARY PROBLEMS (pain or burning when urinating, or frequent urination) *BOWEL PROBLEMS (unusual diarrhea, constipation, pain near the anus) TENDERNESS IN MOUTH AND THROAT WITH OR WITHOUT PRESENCE OF ULCERS (sore throat, sores in mouth, or a toothache) UNUSUAL RASH, SWELLING OR PAIN  UNUSUAL VAGINAL DISCHARGE OR ITCHING   Items with * indicate a potential emergency and should be followed up as soon as possible or go to the Emergency Department if any problems should occur.  Please show the CHEMOTHERAPY ALERT CARD or IMMUNOTHERAPY ALERT CARD at check-in to the  Emergency Department and triage nurse.  Should you have questions after your visit or need to cancel or reschedule your appointment, please contact [facility]  Dept: [phone]  and follow the prompts.  Office hours are 8:00 a.m. to 4:30 p.m. Monday - Friday. Please note that voicemails left after 4:00 p.m. may not be returned until the following business day.  We are closed weekends and major holidays. You have access to a nurse at all times for urgent questions. Please call the main number to the clinic Dept: [phone] and follow the prompts.   For any non-urgent questions, you may also contact your provider using MyChart. We now offer e-Visits for anyone 3 and older to request care online for non-urgent symptoms. For details visit [email or web address].   Also download the MyChart app! Go to the app store, search "MyChart", open the app, select [facility], and log in with your MyChart username and password.  Due to Covid, a mask is required upon entering the hospital/clinic. If you do not have a mask, one will be given to you upon arrival. For doctor visits, patients may have 1 support person aged 42 or older with them. For treatment visits, patients cannot have anyone with them due to current Covid guidelines and our immunocompromised population.   Gemcitabine injection What is this medication? GEMCITABINE (jem SYE ta been) is a chemotherapy drug. This medicine is used to treat many types of cancer like breast cancer, lung cancer, pancreatic cancer,and ovarian cancer. This medicine may be used for other purposes; ask your health care provider orpharmacist if  you have questions. COMMON BRAND NAME(S): Gemzar, Infugem What should I tell my care team before I take this medication? They need to know if you have any of these conditions: blood disorders infection kidney disease liver disease lung or breathing disease, like asthma recent or ongoing radiation  therapy an unusual or allergic reaction to gemcitabine, other chemotherapy, other medicines, foods, dyes, or preservatives pregnant or trying to get pregnant breast-feeding How should I use this medication? This drug is given as an infusion into a vein. It is administered in a hospitalor clinic by a specially trained health care professional. Talk to your pediatrician regarding the use of this medicine in children.Special care may be needed. Overdosage: If you think you have taken too much of this medicine contact apoison control center or emergency room at once. NOTE: This medicine is only for you. Do not share this medicine with others. What if I miss a dose? It is important not to miss your dose. Call your doctor or health careprofessional if you are unable to keep an appointment. What may interact with this medication? medicines to increase blood counts like filgrastim, pegfilgrastim, sargramostim some other chemotherapy drugs like cisplatin vaccines Talk to your doctor or health care professional before taking any of thesemedicines: acetaminophen aspirin ibuprofen ketoprofen naproxen This list may not describe all possible interactions. Give your health care provider a list of all the medicines, herbs, non-prescription drugs, or dietary supplements you use. Also tell them if you smoke, drink alcohol, or use illegaldrugs. Some items may interact with your medicine. What should I watch for while using this medication? Visit your doctor for checks on your progress. This drug may make you feel generally unwell. This is not uncommon, as chemotherapy can affect healthy cells as well as cancer cells. Report any side effects. Continue your course oftreatment even though you feel ill unless your doctor tells you to stop. In some cases, you may be given additional medicines to help with side effects.Follow all directions for their use. Call your doctor or health care professional for advice if  you get a fever, chills or sore throat, or other symptoms of a cold or flu. Do not treat yourself. This drug decreases your body's ability to fight infections. Try toavoid being around people who are sick. This medicine may increase your risk to bruise or bleed. Call your doctor orhealth care professional if you notice any unusual bleeding. Be careful brushing and flossing your teeth or using a toothpick because you may get an infection or bleed more easily. If you have any dental work done,tell your dentist you are receiving this medicine. Avoid taking products that contain aspirin, acetaminophen, ibuprofen, naproxen, or ketoprofen unless instructed by your doctor. These medicines may hide afever. Do not become pregnant while taking this medicine or for 6 months after stopping it. Women should inform their doctor if they wish to become pregnant or think they might be pregnant. Men should not father a child while taking this medicine and for 3 months after stopping it. There is a potential for serious side effects to an unborn child. Talk to your health care professional or pharmacist for more information. Do not breast-feed an infant while takingthis medicine or for at least 1 week after stopping it. Men should inform their doctors if they wish to father a child. This medicine may lower sperm counts. Talk with your doctor or health care professional ifyou are concerned about your fertility. What side effects may I notice from receiving  this medication? Side effects that you should report to your doctor or health care professionalas soon as possible: allergic reactions like skin rash, itching or hives, swelling of the face, lips, or tongue breathing problems pain, redness, or irritation at site where injected signs and symptoms of a dangerous change in heartbeat or heart rhythm like chest pain; dizziness; fast or irregular heartbeat; palpitations; feeling faint or lightheaded, falls; breathing  problems signs of decreased platelets or bleeding - bruising, pinpoint red spots on the skin, black, tarry stools, blood in the urine signs of decreased red blood cells - unusually weak or tired, feeling faint or lightheaded, falls signs of infection - fever or chills, cough, sore throat, pain or difficulty passing urine signs and symptoms of kidney injury like trouble passing urine or change in the amount of urine signs and symptoms of liver injury like dark yellow or brown urine; general ill feeling or flu-like symptoms; light-colored stools; loss of appetite; nausea; right upper belly pain; unusually weak or tired; yellowing of the eyes or skin swelling of ankles, feet, hands Side effects that usually do not require medical attention (report to yourdoctor or health care professional if they continue or are bothersome): constipation diarrhea hair loss loss of appetite nausea rash vomiting This list may not describe all possible side effects. Call your doctor for medical advice about side effects. You may report side effects to FDA at1-800-FDA-1088. Where should I keep my medication? This drug is given in a hospital or clinic and will not be stored at home. NOTE: This sheet is a summary. It may not cover all possible information. If you have questions about this medicine, talk to your doctor, pharmacist, orhealth care provider.  2022 Elsevier/Gold Standard (2017-12-24 18:06:11)

## 2021-05-02 NOTE — Progress Notes (Signed)
Nutrition follow up completed with patient and daughter during infusion for Intrahepatic Cholangiocarcinoma. Patient receiving Gemcitabine. Cisplatin was d/c. Weight decreased and documented as 123 pounds on July 20, decreased from 133 pounds May 16. Labs noted: Na 134, Glucose 105, and Magnesium 1.5  Patient reports severe fatigue contributing to decreased appetite. Also admits to taste alterations. Reports eating a fried egg with spinach and either a biscuit or toast for breakfast. She will eat 1/2 egg salad or tomato sandwich and water for lunch, and baked chicken, green beans and okra for dinner. She drinks 1/2 bottle of a premier protein or ensure max supplement 2 times daily.  Nutrition Diagnosis: Inadequate oral intake continues.  Intervention: Educated on importance of increased calories as well as protein in small meals and snacks about every 2 hours.  Change supplement to Ensure Complete to provide 350 calories and 30 grams protein per bottle. Increase to 2 cartons BID. Provided samples and coupons. Educated on strategies to improve taste alterations. Provided nutrition fact sheet. Questions were answered and teach back method used. Contact information given.  Monitoring, Evaluation, Goals: Patient will increase calories and protein to promote weight stabilization/gain as well as increased energy.  Next Visit: August 3, during infusion.

## 2021-05-06 ENCOUNTER — Other Ambulatory Visit: Payer: Self-pay | Admitting: Oncology

## 2021-05-07 ENCOUNTER — Encounter: Payer: Self-pay | Admitting: Oncology

## 2021-05-11 ENCOUNTER — Other Ambulatory Visit: Payer: Self-pay | Admitting: Oncology

## 2021-05-11 ENCOUNTER — Telehealth: Payer: Self-pay

## 2021-05-11 NOTE — Telephone Encounter (Signed)
TC from Pt's daughter [name] stating Pt has decided to stop treatment and wants to cancel upcoming appointments except she wants to keep appointment to see Dr Benay Spice.Return call to Pt's daughter to confirm treatment schedule is canceled and Pt will see Dr Benay Spice to discuss hospice. Pt's daughter verbalized understanding. No other problems or concerns noted.

## 2021-05-15 ENCOUNTER — Inpatient Hospital Stay: Payer: Medicare Other | Attending: Nurse Practitioner | Admitting: Oncology

## 2021-05-15 ENCOUNTER — Inpatient Hospital Stay: Payer: Medicare Other

## 2021-05-15 ENCOUNTER — Other Ambulatory Visit: Payer: Self-pay

## 2021-05-15 VITALS — BP 135/64 | HR 68 | Temp 98.0°F | Resp 20 | Ht 66.0 in | Wt 128.2 lb

## 2021-05-15 DIAGNOSIS — K625 Hemorrhage of anus and rectum: Secondary | ICD-10-CM | POA: Diagnosis not present

## 2021-05-15 DIAGNOSIS — R630 Anorexia: Secondary | ICD-10-CM | POA: Insufficient documentation

## 2021-05-15 DIAGNOSIS — R5383 Other fatigue: Secondary | ICD-10-CM | POA: Insufficient documentation

## 2021-05-15 DIAGNOSIS — C221 Intrahepatic bile duct carcinoma: Secondary | ICD-10-CM | POA: Diagnosis present

## 2021-05-15 DIAGNOSIS — Z8041 Family history of malignant neoplasm of ovary: Secondary | ICD-10-CM | POA: Diagnosis not present

## 2021-05-15 DIAGNOSIS — R634 Abnormal weight loss: Secondary | ICD-10-CM | POA: Diagnosis not present

## 2021-05-15 DIAGNOSIS — R197 Diarrhea, unspecified: Secondary | ICD-10-CM | POA: Diagnosis not present

## 2021-05-15 DIAGNOSIS — E271 Primary adrenocortical insufficiency: Secondary | ICD-10-CM | POA: Insufficient documentation

## 2021-05-15 DIAGNOSIS — E039 Hypothyroidism, unspecified: Secondary | ICD-10-CM | POA: Diagnosis not present

## 2021-05-15 NOTE — Progress Notes (Addendum)
[name] OFFICE PROGRESS NOTE   Diagnosis: Cholangiocarcinoma  INTERVAL HISTORY:   [name] returns for a scheduled visit.  She completed a treatment with gemcitabine on 05/02/2021.  She reports malaise for several days following gemcitabine.  This has improved.  She has a good appetite.  No pain.  She is here today with her daughter.  [name] has decided against further chemotherapy.  Objective:  Vital signs in last 24 hours:  Blood pressure 135/64, pulse 68, temperature 98 F (36.7 C), temperature source Oral, resp. rate 20, height 5' 6" (1.676 m), weight 128 lb 3.2 oz (58.2 kg), SpO2 98 %.    Resp: Lungs clear bilaterally Cardio: Regular rate and rhythm GI: No mass, no hepatomegaly, nontender Vascular: No leg edema   Portacath/PICC-without erythema  Lab Results:  Lab Results  Component Value Date   WBC 7.9 05/02/2021   HGB 11.6 (L) 05/02/2021   HCT 35.6 (L) 05/02/2021   MCV 94.9 05/02/2021   PLT 577 (H) 05/02/2021   NEUTROABS 5.8 05/02/2021    CMP  Lab Results  Component Value Date   NA 134 (L) 05/02/2021   K 3.8 05/02/2021   CL 99 05/02/2021   CO2 28 05/02/2021   GLUCOSE 105 (H) 05/02/2021   BUN 13 05/02/2021   CREATININE 0.65 05/02/2021   CALCIUM 9.8 05/02/2021   PROT 6.4 (L) 05/02/2021   ALBUMIN 3.8 05/02/2021   AST 52 (H) 05/02/2021   ALT 31 05/02/2021   ALKPHOS 443 (H) 05/02/2021   BILITOT 0.6 05/02/2021   GFRNONAA >60 05/02/2021   GFRAA 64 (L) 11/03/2012    No results found for: CEA1, CEA, CAN199, CA125  Lab Results  Component Value Date   INR 0.9 01/09/2021   LABPROT 12.0 01/09/2021    Imaging:  No results found.  Medications: I have reviewed the patient's current medications.   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 CT abdomen/pelvis 12/21/2020-large right liver mass with peripheral intrahepatic biliary duct dilatation in the right liver, expansile tumor  thrombus occluding the main, left, and right portal veins, mild porta hepatis lymphadenopathy, indeterminate 2 cm hypodense right renal lesion, small volume ascites MRI abdomen 12/30/2020-9.4 x 6.8 cm liver mass centered in segment 5 with involvement of segments 4A and 8, peripheral intrahepatic biliary ductal dilatation, expansile occlusive tumor thrombus in the main, left, right portal veins, mass characterized as LR-M, solitary 0.9 cm segment 2 lesion suspicious for metastasis, mildly enlarged portacaval node, small volume ascites, right renal cortical mass-renal cell carcinoma not excluded Ultrasound-guided biopsy of right liver mass on 01/09/2021-pathology refer to [facility]- carcinoma with predominantly glandular and small foci of hepatocellular differentiation cholangiocarcinoma favored, NGS-IDH1 alteration, MSS, tumor mutation burden 3 Cycle 1 gemcitabine/cisplatin 04/13/2021, day 8 04/19/2021 Cycle 2 gemcitabine/cisplatin 05/02/2021, treatment changed to an every 2-week schedule Gemcitabine alone 05/02/2021 Chemotherapy discontinued per patient preference   Addison's disease Anorexia/weight loss secondary to #1 Family history of ovarian cancer Hypothyroidism Intermittent diarrhea-related to the right liver mass? Report of intermittent small-volume rectal bleeding Severe fatigue/malaise, question related to cisplatin/chemotherapy versus cancer    Disposition: [name] has intrahepatic cholangiocarcinoma.  She completed several treatments with gemcitabine/cisplatin and single agent gemcitabine.  The treatment was complicated by profound malaise.  She has decided against further chemotherapy.  [name] would like to be followed with a comfort care approach.  She agrees to enrollment in home hospice care.  We discussed CPR and ACLS issues.  She will be placed on   a no CODE BLUE status.  [name] will return for an office visit and Port-A-Cath flush in 4 weeks.  She would contact us in the  interim as needed.  Betsy Coder, MD  05/15/2021  10:21 AM I called [name] at approximately 4:45 PM to discuss an alternate treatment option with ivosidenib as her tumor has an IDH 1 alteration.  We reviewed potential toxicities associated with ivosidenib and the expected prolongation of disease-free survival.  She remains comfortable with hospice care and does not wish to begin treatment with ivosidenib.

## 2021-05-16 ENCOUNTER — Inpatient Hospital Stay: Payer: Medicare Other | Admitting: Nutrition

## 2021-05-16 ENCOUNTER — Other Ambulatory Visit: Payer: Self-pay

## 2021-05-16 ENCOUNTER — Inpatient Hospital Stay: Payer: Medicare Other

## 2021-05-16 DIAGNOSIS — C221 Intrahepatic bile duct carcinoma: Secondary | ICD-10-CM

## 2021-05-30 ENCOUNTER — Telehealth: Payer: Self-pay

## 2021-05-30 NOTE — Telephone Encounter (Signed)
(  1:17 pm) SW scheduled initial RN/SW palliative care visit with patient  for 06/13/21 @ 9:30 am.

## 2021-06-01 ENCOUNTER — Other Ambulatory Visit: Payer: Self-pay | Admitting: Oncology

## 2021-06-11 ENCOUNTER — Other Ambulatory Visit: Payer: Self-pay

## 2021-06-11 ENCOUNTER — Inpatient Hospital Stay: Payer: Medicare Other | Admitting: Oncology

## 2021-06-11 ENCOUNTER — Inpatient Hospital Stay: Payer: Medicare Other

## 2021-06-11 VITALS — BP 133/52 | HR 72 | Temp 97.8°F | Resp 18 | Ht 66.0 in | Wt 133.4 lb

## 2021-06-11 DIAGNOSIS — C221 Intrahepatic bile duct carcinoma: Secondary | ICD-10-CM

## 2021-06-11 NOTE — Progress Notes (Signed)
  [facility] OFFICE PROGRESS NOTE   Diagnosis: Cholangiocarcinoma  INTERVAL HISTORY:   [name] returns as scheduled.  No pain.  She recently took a trip to the beach.  She has noted mild discoloration and swelling at the ankles and feet.  She reports the hospice team recommended palliative care.  She plans to meet with the palliative care team this week.  Objective:  Vital signs in last 24 hours:  Blood pressure (!) 133/52, pulse 72, temperature 97.8 F (36.6 C), temperature source Oral, resp. rate 18, height $RemoveBe'5\' 6"'RlpZZuBZL$  (1.676 m), weight 133 lb 6.4 oz (60.5 kg), SpO2 100 %.  Resp: Lungs clear bilaterally Cardio: Regular rate and rhythm GI: Nontender, the liver edge is palpable in the lateral right upper abdomen, no apparent ascites Vascular: Trace ankle edema bilaterally.,  Bilateral lower extremity varicosities     Portacath/PICC-without erythema  Lab Results:  Lab Results  Component Value Date   WBC 7.9 05/02/2021   HGB 11.6 (L) 05/02/2021   HCT 35.6 (L) 05/02/2021   MCV 94.9 05/02/2021   PLT 577 (H) 05/02/2021   NEUTROABS 5.8 05/02/2021    CMP  Lab Results  Component Value Date   NA 134 (L) 05/02/2021   K 3.8 05/02/2021   CL 99 05/02/2021   CO2 28 05/02/2021   GLUCOSE 105 (H) 05/02/2021   BUN 13 05/02/2021   CREATININE 0.65 05/02/2021   CALCIUM 9.8 05/02/2021   PROT 6.4 (L) 05/02/2021   ALBUMIN 3.8 05/02/2021   AST 52 (H) 05/02/2021   ALT 31 05/02/2021   ALKPHOS 443 (H) 05/02/2021   BILITOT 0.6 05/02/2021   GFRNONAA >60 05/02/2021   GFRAA 64 (L) 11/03/2012     Medications: I have reviewed the patient's current medications.   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 CT abdomen/pelvis 12/21/2020-large right liver mass with peripheral intrahepatic biliary duct dilatation in the right liver, expansile tumor thrombus occluding the main, left, and right portal veins, mild porta hepatis  lymphadenopathy, indeterminate 2 cm hypodense right renal lesion, small volume ascites MRI abdomen 12/30/2020-9.4 x 6.8 cm liver mass centered in segment 5 with involvement of segments 4A and 8, peripheral intrahepatic biliary ductal dilatation, expansile occlusive tumor thrombus in the main, left, right portal veins, mass characterized as LR-M, solitary 0.9 cm segment 2 lesion suspicious for metastasis, mildly enlarged portacaval node, small volume ascites, right renal cortical mass-renal cell carcinoma not excluded Ultrasound-guided biopsy of right liver mass on 01/09/2021-pathology refer to [facility]- carcinoma with predominantly glandular and small foci of hepatocellular differentiation cholangiocarcinoma favored, NGS-IDH1 alteration, MSS, tumor mutation burden 3 Cycle 1 gemcitabine/cisplatin 04/13/2021, day 8 04/19/2021 Cycle 2 gemcitabine/cisplatin 05/02/2021, treatment changed to an every 2-week schedule Gemcitabine alone 05/02/2021 Chemotherapy discontinued per patient preference   Addison's disease Anorexia/weight loss secondary to #1 Family history of ovarian cancer Hypothyroidism Intermittent diarrhea-related to the right liver mass? Report of intermittent small-volume rectal bleeding Severe fatigue/malaise, question related to cisplatin/chemotherapy versus cancer-improved     Disposition: [name] appears stable.  She plans to enroll in palliative care later this week.  The ankle edema is likely related to pressure from the liver mass and steroid therapy.  She would like continue follow-up at the Cancer center.  [name] will return for an office visit and Port-A-Cath flush in 6 weeks.  Betsy Coder, MD  06/11/2021  3:10 PM

## 2021-06-13 ENCOUNTER — Other Ambulatory Visit: Payer: Medicare Other

## 2021-06-13 ENCOUNTER — Other Ambulatory Visit: Payer: Medicare Other | Admitting: *Deleted

## 2021-06-13 ENCOUNTER — Other Ambulatory Visit: Payer: Self-pay | Admitting: Oncology

## 2021-06-13 ENCOUNTER — Other Ambulatory Visit: Payer: Self-pay

## 2021-06-13 VITALS — BP 135/70 | HR 71 | Temp 97.7°F | Resp 17

## 2021-06-13 DIAGNOSIS — Z515 Encounter for palliative care: Secondary | ICD-10-CM

## 2021-06-13 NOTE — Progress Notes (Signed)
COMMUNITY PALLIATIVE CARE SW NOTE  PATIENT NAME: [name] DOB: 27-Nov-1931 MRN: [id number]  PRIMARY CARE PROVIDER: Chesley Noon, MD  RESPONSIBLE PARTY:  Acct ID - Guarantor Home Phone Work Phone Relationship Acct Type  [id number] [name], [name]  Self P/F     [address], [place], [place] [address]     PLAN OF CARE and INTERVENTIONS:             GOALS OF CARE/ ADVANCE CARE PLANNING:  Goal is for patient to remain at home as independent as possible.  SOCIAL/EMOTIONAL/SPIRITUAL ASSESSMENT/ INTERVENTIONS:  SW and RN-M Nadara Mustard completed an initial visit with patient at her home. The team provided education regarding the palliative care team and program. Patient verbalized understanding of education and provided written consent. Patient provided a status update on patient. She reported that she stopped chemo in July and felt better. She denied pain. Patient report that her breathing is good. She is independent for all ADL's. She still drives to run errands. Her appetite is good. She has no problems swallowing. Her bowel movements and urine output is good. She take naps during the day. No skin issues. Patient report that she has a very supportive family. SOCIAL HISTORY: Patient was born and raised in [place]. She completed high school. She worked and retired from [employer]. She is widowed with two children.  Patient is Methodist by [employer]. Patient has a DNR. Patient remains open to ongoing palliative care support. PATIENT/CAREGIVER EDUCATION/ COPING:  Patient appears to be coping well. She was in good spirits. PERSONAL EMERGENCY PLAN:  911 can be accessed for emergencies. COMMUNITY RESOURCES COORDINATION/ HEALTH CARE NAVIGATION:  None. FINANCIAL/LEGAL CONCERNS/INTERVENTIONS:  None.     SOCIAL HX:  Social History   Tobacco Use   Smoking status: Former    Packs/day: 0.50    Years: 7.00    Pack years: 3.50    Types: Cigarettes    Quit date: 01/30/1979     Years since quitting: 42.3   Smokeless tobacco: Never  Substance Use Topics   Alcohol use: Yes    Alcohol/week: 1.0 standard drink    Types: 1 Glasses of wine per week    CODE STATUS: DNR ADVANCED DIRECTIVES: No MOST FORM COMPLETE:  No HOSPICE EDUCATION PROVIDED: No  PPS: Patient is alert and oriented x3. She is independent for all ADL's.  Duration of visit and documentation: 60 minutes.   [address] [place], [place]

## 2021-06-13 NOTE — Progress Notes (Signed)
[facility] COMMUNITY PALLIATIVE CARE RN NOTE  PATIENT NAME: [name] DOB: October 23, 1931 MRN: [id number] PRIMARY CARE PROVIDER: Chesley Noon, MD  RESPONSIBLE PARTY:  Acct ID - Guarantor Home Phone Work Phone Relationship Acct Type  [id number] XIANA, [phone]  Self P/F     [address], [place], [place] [address]   Covid-19 Pre-screening Negative  PLAN OF CARE and INTERVENTION:  ADVANCE CARE PLANNING/GOALS OF CARE: Goal is for patient to have a good quality life and remain as active as possible. She has a DNR. PATIENT/CAREGIVER EDUCATION; Explained palliative care services and each discipline's role DISEASE STATUS; Joint initial palliative care visit completed with LCSW, M. Lonon. Met with patient in her home. Patient has Intrahepatic cholangiocarcinoma and has decided to stop chemotherapy. She decided to be placed under palliative care vs hospice until she becomes more symptomatic with her disease. She denies pain and shortness of breath. She travelled to the beach a week ago and really enjoyed this time with her family. She is independent with all ADLs and is still driving. Her appetite is good. She denies dysphagia. She feels her current medication regimen is effective. No urinary or bowel issues. She does take a nap during the day if she is tired. She is sleeping well during the night. She says she has started drinking more water so has to get up at times in the middle of the night, but is able to fall back asleep without issues. She is having more "good days" since she has stopped treatments. No skin issues. She is agreeable to being followed by Palliative care. Consents signed. Follow up visit scheduled for 6 weeks.  HISTORY OF PRESENT ILLNESS:  This is a 87 yo female with a diagnosis of cholangiocarcinoma of the liver. Palliative care team has been asked to follow patient for goals of care, symptom management and complex decision making.  CODE STATUS: DNR ADVANCED  DIRECTIVES: Y MOST FORM: no PPS: 100%   PHYSICAL EXAM:   VITALS: Today's Vitals   06/13/21 0934  BP: 135/70  Pulse: 71  Resp: 17  Temp: 97.7 F (36.5 C)  TempSrc: Temporal  SpO2: 97%  PainSc: 0-No pain    LUNGS: clear to auscultation  CARDIAC: Cor RRR EXTREMITIES: No edema SKIN:  No skin issues   NEURO:  Alert and oriented x 4, pleasant mood, ambulatory   (Duration of visit and documentation 45 minutes)   Daryl Eastern, RN BSN

## 2021-06-19 ENCOUNTER — Telehealth: Payer: Self-pay

## 2021-06-19 NOTE — Telephone Encounter (Signed)
TC from pt inquiring about receiving prolia injection. Discussed with Dr Benay Spice who stated Pt can take prolia injection. V/M message left for Pt informed to give a return call with any problems or concerns.

## 2021-07-22 ENCOUNTER — Encounter (HOSPITAL_BASED_OUTPATIENT_CLINIC_OR_DEPARTMENT_OTHER): Payer: Self-pay | Admitting: *Deleted

## 2021-07-22 ENCOUNTER — Emergency Department (HOSPITAL_BASED_OUTPATIENT_CLINIC_OR_DEPARTMENT_OTHER): Payer: Medicare Other

## 2021-07-22 ENCOUNTER — Emergency Department (HOSPITAL_BASED_OUTPATIENT_CLINIC_OR_DEPARTMENT_OTHER)
Admission: EM | Admit: 2021-07-22 | Discharge: 2021-07-22 | Disposition: A | Discharge status: Home / Self Care | Payer: Medicare Other | Attending: Student | Admitting: Student

## 2021-07-22 ENCOUNTER — Encounter: Payer: Self-pay | Admitting: Oncology

## 2021-07-22 ENCOUNTER — Other Ambulatory Visit: Payer: Self-pay

## 2021-07-22 DIAGNOSIS — R7401 Elevation of levels of liver transaminase levels: Secondary | ICD-10-CM | POA: Diagnosis not present

## 2021-07-22 DIAGNOSIS — Z79899 Other long term (current) drug therapy: Secondary | ICD-10-CM | POA: Diagnosis not present

## 2021-07-22 DIAGNOSIS — I447 Left bundle-branch block, unspecified: Secondary | ICD-10-CM | POA: Insufficient documentation

## 2021-07-22 DIAGNOSIS — E876 Hypokalemia: Secondary | ICD-10-CM | POA: Insufficient documentation

## 2021-07-22 DIAGNOSIS — C221 Intrahepatic bile duct carcinoma: Secondary | ICD-10-CM | POA: Diagnosis not present

## 2021-07-22 DIAGNOSIS — R0602 Shortness of breath: Secondary | ICD-10-CM

## 2021-07-22 DIAGNOSIS — I3139 Other pericardial effusion (noninflammatory): Secondary | ICD-10-CM | POA: Diagnosis not present

## 2021-07-22 DIAGNOSIS — U071 COVID-19: Secondary | ICD-10-CM

## 2021-07-22 DIAGNOSIS — E039 Hypothyroidism, unspecified: Secondary | ICD-10-CM | POA: Insufficient documentation

## 2021-07-22 DIAGNOSIS — R509 Fever, unspecified: Secondary | ICD-10-CM | POA: Diagnosis present

## 2021-07-22 DIAGNOSIS — Z87891 Personal history of nicotine dependence: Secondary | ICD-10-CM | POA: Insufficient documentation

## 2021-07-22 LAB — CBC WITH DIFFERENTIAL/PLATELET
Abs Immature Granulocytes: 0.04 10*3/uL (ref 0.00–0.07)
Basophils Absolute: 0.1 10*3/uL (ref 0.0–0.1)
Basophils Relative: 1 %
Eosinophils Absolute: 0.1 10*3/uL (ref 0.0–0.5)
Eosinophils Relative: 1 %
HCT: 38.9 % (ref 36.0–46.0)
Hemoglobin: 13 g/dL (ref 12.0–15.0)
Immature Granulocytes: 1 %
Lymphocytes Relative: 10 %
Lymphs Abs: 0.9 10*3/uL (ref 0.7–4.0)
MCH: 32.3 pg (ref 26.0–34.0)
MCHC: 33.4 g/dL (ref 30.0–36.0)
MCV: 96.5 fL (ref 80.0–100.0)
Monocytes Absolute: 0.6 10*3/uL (ref 0.1–1.0)
Monocytes Relative: 7 %
Neutro Abs: 6.8 10*3/uL (ref 1.7–7.7)
Neutrophils Relative %: 80 %
Platelets: 222 10*3/uL (ref 150–400)
RBC: 4.03 MIL/uL (ref 3.87–5.11)
RDW: 14 % (ref 11.5–15.5)
WBC: 8.5 10*3/uL (ref 4.0–10.5)
nRBC: 0 % (ref 0.0–0.2)

## 2021-07-22 LAB — COMPREHENSIVE METABOLIC PANEL
ALT: 22 U/L (ref 0–44)
AST: 48 U/L — ABNORMAL HIGH (ref 15–41)
Albumin: 3.7 g/dL (ref 3.5–5.0)
Alkaline Phosphatase: 277 U/L — ABNORMAL HIGH (ref 38–126)
Anion gap: 11 (ref 5–15)
BUN: 16 mg/dL (ref 8–23)
CO2: 27 mmol/L (ref 22–32)
Calcium: 9.5 mg/dL (ref 8.9–10.3)
Chloride: 99 mmol/L (ref 98–111)
Creatinine, Ser: 0.62 mg/dL (ref 0.44–1.00)
GFR, Estimated: >60 mL/min (ref >60)
Glucose, Bld: 82 mg/dL (ref 70–99)
Potassium: 3.1 mmol/L — ABNORMAL LOW (ref 3.5–5.1)
Sodium: 137 mmol/L (ref 135–145)
Total Bilirubin: 0.9 mg/dL (ref 0.3–1.2)
Total Protein: 6.7 g/dL (ref 6.5–8.1)

## 2021-07-22 LAB — URINALYSIS, ROUTINE W REFLEX MICROSCOPIC
Bilirubin Urine: NEGATIVE
Glucose, UA: NEGATIVE mg/dL
Hgb urine dipstick: NEGATIVE
Ketones, ur: NEGATIVE mg/dL
Leukocytes,Ua: NEGATIVE
Nitrite: NEGATIVE
Protein, ur: NEGATIVE mg/dL
Specific Gravity, Urine: >1.046 — ABNORMAL HIGH (ref 1.005–1.030)
pH: 7 (ref 5.0–8.0)

## 2021-07-22 LAB — PROCALCITONIN: Procalcitonin: 9.46 ng/mL

## 2021-07-22 LAB — LACTATE DEHYDROGENASE: LDH: 230 U/L — ABNORMAL HIGH (ref 98–192)

## 2021-07-22 LAB — C-REACTIVE PROTEIN: CRP: 4.2 mg/dL — ABNORMAL HIGH (ref <1.0)

## 2021-07-22 LAB — TROPONIN I (HIGH SENSITIVITY)
Troponin I (High Sensitivity): 12 ng/L (ref <18)
Troponin I (High Sensitivity): 17 ng/L (ref <18)

## 2021-07-22 LAB — LACTIC ACID, PLASMA: Lactic Acid, Venous: 1.5 mmol/L (ref 0.5–1.9)

## 2021-07-22 LAB — PROTIME-INR
INR: 0.9 (ref 0.8–1.2)
Prothrombin Time: 12.3 seconds (ref 11.4–15.2)

## 2021-07-22 LAB — RESP PANEL BY RT-PCR (FLU A&B, COVID) ARPGX2
Influenza A by PCR: NEGATIVE
Influenza B by PCR: NEGATIVE
SARS Coronavirus 2 by RT PCR: POSITIVE — AB

## 2021-07-22 LAB — FERRITIN: Ferritin: 153 ng/mL (ref 11–307)

## 2021-07-22 LAB — HEPATITIS B SURFACE ANTIGEN: Hepatitis B Surface Ag: NONREACTIVE

## 2021-07-22 LAB — D-DIMER, QUANTITATIVE: D-Dimer, Quant: 5.07 ug/mL-FEU — ABNORMAL HIGH (ref 0.00–0.50)

## 2021-07-22 LAB — APTT: aPTT: 32 seconds (ref 24–36)

## 2021-07-22 LAB — BRAIN NATRIURETIC PEPTIDE: B Natriuretic Peptide: 241.6 pg/mL — ABNORMAL HIGH (ref 0.0–100.0)

## 2021-07-22 MED ORDER — POTASSIUM CHLORIDE CRYS ER 20 MEQ PO TBCR
40.0000 meq | EXTENDED_RELEASE_TABLET | ORAL | Status: AC
Start: 2021-07-22 — End: 2021-07-22

## 2021-07-22 MED ORDER — IOHEXOL 350 MG/ML SOLN
100.0000 mL | Freq: Once | INTRAVENOUS | Status: AC | PRN
  Administered 2021-07-22: 100 mL via INTRAVENOUS

## 2021-07-22 MED ORDER — PREDNISOLONE SODIUM PHOSPHATE 15 MG/5ML PO SOLN
2.0000 mg | Freq: Three times a day (TID) | ORAL | Status: DC
  Filled 2021-07-22: qty 1

## 2021-07-22 MED ORDER — FLUDROCORTISONE ACETATE 0.1 MG PO TABS
0.0500 mg | ORAL_TABLET | Freq: Every day | ORAL | Status: DC
  Administered 2021-07-22: 0.05 mg via ORAL

## 2021-07-22 MED ORDER — HEPARIN SOD (PORK) LOCK FLUSH 100 UNIT/ML IV SOLN
500.0000 [IU] | Freq: Once | INTRAVENOUS | Status: AC
  Administered 2021-07-22: 500 [IU]
  Filled 2021-07-22: qty 5

## 2021-07-22 MED ORDER — DIPHENHYDRAMINE HCL 50 MG/ML IJ SOLN
12.5000 mg | Freq: Once | INTRAMUSCULAR | Status: AC
  Administered 2021-07-22: 12.5 mg via INTRAVENOUS
  Filled 2021-07-22: qty 1

## 2021-07-22 MED ORDER — HYDROCORTISONE 5 MG PO TABS: 5.0000 mg | ORAL_TABLET | ORAL | Status: DC

## 2021-07-22 MED ORDER — OXYCODONE HCL 5 MG PO TABS: 5.0000 mg | ORAL_TABLET | Freq: Four times a day (QID) | ORAL | 0 refills | Status: DC | PRN

## 2021-07-22 MED ORDER — LEVOTHYROXINE SODIUM 100 MCG PO TABS
100.0000 ug | ORAL_TABLET | Freq: Every morning | ORAL | Status: DC
  Administered 2021-07-22: 100 ug via ORAL
  Filled 2021-07-22: qty 1

## 2021-07-22 MED ORDER — NIRMATRELVIR/RITONAVIR (PAXLOVID)TABLET: 3.0000 | ORAL_TABLET | Freq: Two times a day (BID) | ORAL | 0 refills | Status: AC

## 2021-07-22 MED ORDER — PROCHLORPERAZINE EDISYLATE 10 MG/2ML IJ SOLN
10.0000 mg | Freq: Once | INTRAMUSCULAR | Status: AC
  Administered 2021-07-22: 10 mg via INTRAVENOUS
  Filled 2021-07-22: qty 2

## 2021-07-22 NOTE — ED Notes (Signed)
Dr. Matilde Sprang is aware that we do not have the ordered oral steroids here at [facility].

## 2021-07-22 NOTE — ED Triage Notes (Signed)
Increased Pain in  Rt lateral mid abd. No N/V Also fever reported at home.   Pt is a [place] pt.

## 2021-07-22 NOTE — ED Notes (Signed)
Dr. Gilford Raid reviews all data and phones hospitalist. Plan to d/c home per pt. And family request.

## 2021-07-22 NOTE — ED Provider Notes (Signed)
[facility] EMERGENCY DEPT Provider Note   CSN: [id number] Arrival date & time: 07/22/21  7001     History Chief Complaint  Patient presents with   Fever   Pain     [name] is a 87 y.o. 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  Patient states that her pain has been progressively worsening over the last 3 days and she awoke with a fever to 103.0 this morning.  She took ibuprofen prior to arrival and currently is afebrile.  She also endorses shortness of breath that is new for her.  Denies hemoptysis, hematochezia, dysuria, increased frequency, chest pain or other systemic symptoms.   Fever Associated symptoms: no chest pain, no chills, no cough, no dysuria, no ear pain, no rash, no sore throat and no vomiting       Past Medical History:  Diagnosis Date   Addison's disease ([place])    Hypothyroidism    Osteoporosis     Patient Active Problem List   Diagnosis Date Noted   Pericardial effusion 07/22/2021   Cholangiocarcinoma of liver ([place]) 03/27/2021   Goals of care, counseling/discussion 03/27/2021   Elevated LFTs 12/20/2020   Elevated alkaline phosphatase level 12/20/2020   Change in bowel habits 12/20/2020   Abdominal distension (gaseous) 12/20/2020   Lower abdominal pain 12/20/2020    Past Surgical History:  Procedure Laterality Date   ganglion cyst foot     IR FLUORO GUIDE CV LINE RIGHT  03/30/2021   IR IMAGING GUIDED PORT INSERTION  03/30/2021     OB History   No obstetric history on file.     Family History  Problem Relation Age of Onset   Lung cancer Father    Angina Father    Stroke Mother    Hypertension Brother    Ovarian cancer Sister     Social History   Tobacco Use   Smoking status: Former    Packs/day: 0.50    Years: 7.00    Pack  years: 3.50    Types: Cigarettes    Quit date: 01/30/1979    Years since quitting: 42.5   Smokeless tobacco: Never  Vaping Use   Vaping Use: Never used  Substance Use Topics   Alcohol use: Yes    Alcohol/week: 1.0 standard drink    Types: 1 Glasses of wine per week   Drug use: Never    Home Medications Prior to Admission medications   Medication Sig Start Date End Date Taking? Authorizing Provider  calcium carbonate (TUMS - DOSED IN MG ELEMENTAL CALCIUM) 500 MG chewable tablet Chew 500 mg by mouth daily as needed for indigestion or heartburn.    [provider]  Calcium Citrate-Vitamin D (CALCIUM + D PO) Take 1 tablet by mouth daily.    [provider]  Cholecalciferol (VITAMIN D) 50 MCG (2000 UT) tablet Take 2,000 Units by mouth daily.    [provider]  fludrocortisone (FLORINEF) 0.1 MG tablet Take 0.05 mg by mouth daily.    [provider]  hydrocortisone (CORTEF) 10 MG tablet Take 5-10 mg by mouth See admin instructions. Take 7.29m at 7 am, 5 mg at noon, 5 mg at 3pm    [provider]  KLOR-CON M20 20 MEQ tablet TAKE 1 TABLET BY MOUTH TWICE A DAY 06/14/21   SLadell Pier MD  levothyroxine (SYNTHROID)  100 MCG tablet Take 100 mcg by mouth every morning. 05/10/21   [provider]  levothyroxine (SYNTHROID, LEVOTHROID) 88 MCG tablet Take 88 mcg by mouth daily.    [provider]  lidocaine-prilocaine (EMLA) cream Apply 1 application topically as directed. Apply 1/2 tablespoon to port site 2 hours prior to stick and cover with press-and-seal or plastic wrap 04/06/21   Ladell Pier, MD  ondansetron (ZOFRAN) 8 MG tablet Take 1 tablet (8 mg total) by mouth every 8 (eight) hours as needed for nausea or vomiting. Start 72 hours after Day 1 of chemo cycle 04/06/21   Ladell Pier, MD  prochlorperazine (COMPAZINE) 5 MG tablet Take 1 tablet (5 mg total) by mouth every 6 (six) hours as needed for nausea or vomiting. 04/06/21    Ladell Pier, MD  Wheat Dextrin (BENEFIBER PO) Take 3 tablets by mouth daily as needed (constipation).    [provider]    Allergies    Patient has no known allergies.  Review of Systems   Review of Systems  Constitutional:  Positive for fever. Negative for chills.  HENT:  Negative for ear pain and sore throat.   Eyes:  Negative for pain and visual disturbance.  Respiratory:  Positive for shortness of breath. Negative for cough.   Cardiovascular:  Negative for chest pain and palpitations.  Gastrointestinal:  Positive for abdominal pain. Negative for vomiting.  Genitourinary:  Negative for dysuria and hematuria.  Musculoskeletal:  Negative for arthralgias and back pain.  Skin:  Negative for color change and rash.  Neurological:  Negative for seizures and syncope.  All other systems reviewed and are negative.  Physical Exam Updated Vital Signs BP 124/63 (BP Location: Right Arm)   Pulse 91   Temp 98.2 F (36.8 C) (Oral)   Resp 15   Ht 5' 6"  (1.676 m)   Wt 58.1 kg   SpO2 95%   BMI 20.66 kg/m   Physical Exam Vitals and nursing note reviewed.  Constitutional:      General: She is not in acute distress.    Appearance: She is well-developed.  HENT:     Head: Normocephalic and atraumatic.  Eyes:     Conjunctiva/sclera: Conjunctivae normal.  Cardiovascular:     Rate and Rhythm: Normal rate and regular rhythm.     Heart sounds: No murmur heard. Pulmonary:     Effort: Pulmonary effort is normal. No respiratory distress.     Breath sounds: Normal breath sounds.  Abdominal:     Palpations: Abdomen is soft.     Tenderness: There is abdominal tenderness (RUQ).  Musculoskeletal:     Cervical back: Neck supple.  Skin:    General: Skin is warm and dry.  Neurological:     Mental Status: She is alert.    ED Results / Procedures / Treatments   Labs (all labs ordered are listed, but only abnormal results are displayed) Labs Reviewed  RESP PANEL BY RT-PCR (FLU  A&B, COVID) ARPGX2 - Abnormal; Notable for the following components:      Result Value   SARS Coronavirus 2 by RT PCR POSITIVE (*)    All other components within normal limits  COMPREHENSIVE METABOLIC PANEL - Abnormal; Notable for the following components:   Potassium 3.1 (*)    AST 48 (*)    Alkaline Phosphatase 277 (*)    All other components within normal limits  URINALYSIS, ROUTINE W REFLEX MICROSCOPIC - Abnormal; Notable for the following components:  Specific Gravity, Urine >1.046 (*)    All other components within normal limits  D-DIMER, QUANTITATIVE - Abnormal; Notable for the following components:   D-Dimer, Quant 5.07 (*)    All other components within normal limits  CULTURE, BLOOD (SINGLE)  URINE CULTURE  LACTIC ACID, PLASMA  CBC WITH DIFFERENTIAL/PLATELET  PROTIME-INR  APTT  COXSACKIE A VIRUS ANTIBODIES  COXSACKIE B VIRUS ANTIBODIES  BRAIN NATRIURETIC PEPTIDE  C-REACTIVE PROTEIN  FERRITIN  FIBRINOGEN  HEPATITIS B SURFACE ANTIGEN  LACTATE DEHYDROGENASE  PROCALCITONIN  TROPONIN I (HIGH SENSITIVITY)    EKG EKG Interpretation  Date/Time:  Sunday July 22 2021 08:28:03 EDT Ventricular Rate:  94 PR Interval:  163 QRS Duration: 146 QT Interval:  367 QTC Calculation: 459 R Axis:   55 Text Interpretation: Sinus rhythm Left bundle branch block Similar to previous ECG Confirmed by [place] ([id number]) on 07/22/2021 8:29:59 AM  Radiology CT Angio Chest PE W and/or Wo Contrast  Result Date: 07/22/2021 CLINICAL DATA:  Right upper quadrant abdominal pain and fever. History of cholangiocarcinoma. EXAM: CT ANGIOGRAPHY CHEST CT ABDOMEN AND PELVIS WITH CONTRAST TECHNIQUE: Multidetector CT imaging of the chest was performed using the standard protocol during bolus administration of intravenous contrast. Multiplanar CT image reconstructions and MIPs were obtained to evaluate the vascular anatomy. Multidetector CT imaging of the abdomen and pelvis was performed using the  standard protocol during bolus administration of intravenous contrast. CONTRAST:  153m OMNIPAQUE IOHEXOL 350 MG/ML SOLN COMPARISON:  December 21, 2020 FINDINGS: CTA CHEST FINDINGS Cardiovascular: Mildly enlarged heart. Right-sided injectable port noted. Mildly enlarged heart. 7 mm in maximum thickness pericardial effusion. No evidence of pulmonary embolus. Mediastinum/Nodes: No enlarged mediastinal, hilar, or axillary lymph nodes. Thyroid gland, trachea, and esophagus demonstrate no significant findings. Lungs/Pleura: Lungs are clear. No pleural effusion or pneumothorax. Musculoskeletal: No chest wall abnormality. No acute or significant osseous findings. Review of the MIP images confirms the above findings. CT ABDOMEN and PELVIS FINDINGS Hepatobiliary: Large irregular multiloculated soft tissue mass occupies most of the volume of the right lobe of the liver and extends to the caudate lobe. It measures approximately 13 x 8.9 x 9.5 cm. There is tumor extension into the hepatic veins and right main portal vein. There is moderate dilation of the intrahepatic bile ducts. The gallbladder is normally distended. Pancreas: Unremarkable. No pancreatic ductal dilatation or surrounding inflammatory changes. Spleen: Normal in size without focal abnormality. Adrenals/Urinary Tract: Numerous too small to be actually characterized circumscribed hypoattenuated lesions throughout both kidneys. Some of the larger 1 measuring water density. There is a 2.0 x 1.5 cm mass in the lower pole of the right kidney which is hyper attenuated, but stable in size from the abdominal MRI dated March 2022. The urinary bladder is normal. Stomach/Bowel: No evidence of small-bowel obstruction. The stomach and small bowel have normal appearance. Long segment of circumferential thickening of the ascending colon may represent reactive changes or tumor extension. Vascular/Lymphatic: Aortic atherosclerosis. Mildly enlarged retroperitoneal lymph nodes in the  upper abdomen. Reproductive: Uterus and bilateral adnexa are unremarkable. Other: Moderate water density ascites in the abdomen and pelvis. Musculoskeletal: No acute or significant osseous findings. Review of the MIP images confirms the above findings. IMPRESSION: 1. No evidence of pulmonary embolus. 2. Mildly enlarged heart with 7 mm in maximum thickness pericardial effusion. 3. Interval enlargement of the ill marginated liver mass which occupies much of the volume of the right lobe of the liver and extends to the caudate lobe. There is apparent tumor extension  into the hepatic veins and right portal vein. 4. Moderate dilation of the intrahepatic bile ducts. 5. Long segment of circumferential thickening of the ascending colon may represent reactive changes or tumor extension. 6. Moderate in volume water density ascites in the abdomen and pelvis. 7. Mildly enlarged retroperitoneal lymph nodes in the upper abdomen. 8. There is a 2.0 x 1.5 cm mass in the lower pole of the right kidney which is hyper attenuated, but stable in size from the abdominal MRI dated March 2022. Aortic Atherosclerosis (ICD10-I70.0). Electronically Signed   By: Fidela Salisbury M.D.   On: 07/22/2021 10:54   CT ABDOMEN PELVIS W CONTRAST  Result Date: 07/22/2021 CLINICAL DATA:  Right upper quadrant abdominal pain and fever. History of cholangiocarcinoma. EXAM: CT ANGIOGRAPHY CHEST CT ABDOMEN AND PELVIS WITH CONTRAST TECHNIQUE: Multidetector CT imaging of the chest was performed using the standard protocol during bolus administration of intravenous contrast. Multiplanar CT image reconstructions and MIPs were obtained to evaluate the vascular anatomy. Multidetector CT imaging of the abdomen and pelvis was performed using the standard protocol during bolus administration of intravenous contrast. CONTRAST:  118m OMNIPAQUE IOHEXOL 350 MG/ML SOLN COMPARISON:  December 21, 2020 FINDINGS: CTA CHEST FINDINGS Cardiovascular: Mildly enlarged heart.  Right-sided injectable port noted. Mildly enlarged heart. 7 mm in maximum thickness pericardial effusion. No evidence of pulmonary embolus. Mediastinum/Nodes: No enlarged mediastinal, hilar, or axillary lymph nodes. Thyroid gland, trachea, and esophagus demonstrate no significant findings. Lungs/Pleura: Lungs are clear. No pleural effusion or pneumothorax. Musculoskeletal: No chest wall abnormality. No acute or significant osseous findings. Review of the MIP images confirms the above findings. CT ABDOMEN and PELVIS FINDINGS Hepatobiliary: Large irregular multiloculated soft tissue mass occupies most of the volume of the right lobe of the liver and extends to the caudate lobe. It measures approximately 13 x 8.9 x 9.5 cm. There is tumor extension into the hepatic veins and right main portal vein. There is moderate dilation of the intrahepatic bile ducts. The gallbladder is normally distended. Pancreas: Unremarkable. No pancreatic ductal dilatation or surrounding inflammatory changes. Spleen: Normal in size without focal abnormality. Adrenals/Urinary Tract: Numerous too small to be actually characterized circumscribed hypoattenuated lesions throughout both kidneys. Some of the larger 1 measuring water density. There is a 2.0 x 1.5 cm mass in the lower pole of the right kidney which is hyper attenuated, but stable in size from the abdominal MRI dated March 2022. The urinary bladder is normal. Stomach/Bowel: No evidence of small-bowel obstruction. The stomach and small bowel have normal appearance. Long segment of circumferential thickening of the ascending colon may represent reactive changes or tumor extension. Vascular/Lymphatic: Aortic atherosclerosis. Mildly enlarged retroperitoneal lymph nodes in the upper abdomen. Reproductive: Uterus and bilateral adnexa are unremarkable. Other: Moderate water density ascites in the abdomen and pelvis. Musculoskeletal: No acute or significant osseous findings. Review of the MIP  images confirms the above findings. IMPRESSION: 1. No evidence of pulmonary embolus. 2. Mildly enlarged heart with 7 mm in maximum thickness pericardial effusion. 3. Interval enlargement of the ill marginated liver mass which occupies much of the volume of the right lobe of the liver and extends to the caudate lobe. There is apparent tumor extension into the hepatic veins and right portal vein. 4. Moderate dilation of the intrahepatic bile ducts. 5. Long segment of circumferential thickening of the ascending colon may represent reactive changes or tumor extension. 6. Moderate in volume water density ascites in the abdomen and pelvis. 7. Mildly enlarged retroperitoneal lymph nodes in  the upper abdomen. 8. There is a 2.0 x 1.5 cm mass in the lower pole of the right kidney which is hyper attenuated, but stable in size from the abdominal MRI dated March 2022. Aortic Atherosclerosis (ICD10-I70.0). Electronically Signed   By: Fidela Salisbury M.D.   On: 07/22/2021 10:54   DG Chest Port 1 View  Result Date: 07/22/2021 CLINICAL DATA:  87 year old female with history of fever and abdominal pain. Possible sepsis. EXAM: PORTABLE CHEST 1 VIEW COMPARISON:  Chest x-ray 11/03/2012. FINDINGS: Right internal jugular single-lumen power porta cath with tip terminating in the distal superior vena cava. Lung volumes are normal. No consolidative airspace disease. No pleural effusions. No pneumothorax. No pulmonary nodule or mass noted. Pulmonary vasculature is normal. Heart size is mildly enlarged. Upper mediastinal contours are within normal limits. Atherosclerosis in the thoracic aorta. IMPRESSION: 1. Support apparatus, as above. 2. No radiographic evidence of acute cardiopulmonary disease. 3. Aortic atherosclerosis. 4. Mild cardiomegaly. Electronically Signed   By: Vinnie Langton M.D.   On: 07/22/2021 08:53    Procedures Procedures   Medications Ordered in ED Medications  fludrocortisone (FLORINEF) tablet 0.05 mg (has  no administration in time range)  hydrocortisone (CORTEF) tablet 5-10 mg (has no administration in time range)  levothyroxine (SYNTHROID) tablet 100 mcg (100 mcg Oral Given 07/22/21 1114)  potassium chloride SA (KLOR-CON) CR tablet 40 mEq (has no administration in time range)  iohexol (OMNIPAQUE) 350 MG/ML injection 100 mL (100 mLs Intravenous Contrast Given 07/22/21 0948)    ED Course  I have reviewed the triage vital signs and the nursing notes.  Pertinent labs & imaging results that were available during my care of the patient were reviewed by me and considered in my medical decision making (see chart for details).  Clinical Course as of 07/22/21 1352  Sun Jul 22, 2021  1157 croitoru [MK]    Clinical Course User Index [MK] Teressa Lower, MD   MDM Rules/Calculators/A&P                          Patient seen emergency department for evaluation of abdominal pain, fever and exertional shortness of breath.  Physical exam reveals mild tenderness to the right upper quadrant but is otherwise unremarkable.  Laboratory evaluation with hypokalemia 3.1, alk phos elevated to 277, AST 48 but is otherwise unremarkable.  COVID test is positive which is likely the source of the patient's fever.  Chest x-ray unremarkable.  In the setting of the patient's exertional shortness of breath and active cancer diagnosis, a CT PE was obtained that shows no evidence of PE but does show a 7 mm pericardial effusion.  A CT abdomen pelvis shows interval enlargement of a liver mass with tumor extension into the hepatic and right portal veins, long segment of circumferential thickening that is likely metastatic disease to the ascending colon as well as moderate volume abdominal ascites.  In the setting the patient's exertional shortness of breath and newly discovered pericardial effusion, I spoke with cardiology who agrees the patient should receive an echo and they will see her when she is admitted.  I spoke with the  hospitalist team who agreed to admit the patient the patient was transferred to [facility] for admission.  Final Clinical Impression(s) / ED Diagnoses Final diagnoses:  Shortness of breath  Pericardial effusion    Rx / DC Orders ED Discharge Orders     None        Treyveon Mochizuki, [place],  MD 07/22/21 1400

## 2021-07-22 NOTE — ED Notes (Signed)
Bed 2C05@ [facility] assigned

## 2021-07-22 NOTE — ED Provider Notes (Signed)
Pt was seen earlier in the day by Dr. Tinnie Gens.  She is a hospice patient with cholangiocarcinoma and also with a fever.  She was found to be Covid positive.  She also had a small pericardial effusion on eval.  Pt's tumor is also much bigger.  He admitted patient to [facility] for an ECHO and further eval.  Pt is comfortable now and is requesting to go home.  She and her daughter do not want any treatment for her pericardial effusion.  She and her daughter understand it can get worse and cause death.  As she has metastatic cancer and is on hospice, I think it is reasonable to go home so she can be in a more comfortable place.  Her daughter will stay with her tonight.  Pt is interested in getting treatment for her Covid.  She is d/c with Paxlovid and Oxy IR for pain.  She is to return if worse.  Dr. Roosevelt Locks and [facility] notified.   Isla Pence, MD 07/22/21 [phone]

## 2021-07-23 ENCOUNTER — Inpatient Hospital Stay: Payer: Medicare Other | Admitting: Oncology

## 2021-07-23 ENCOUNTER — Inpatient Hospital Stay: Payer: Medicare Other

## 2021-07-23 ENCOUNTER — Other Ambulatory Visit: Payer: Medicare Other

## 2021-07-23 LAB — URINE CULTURE: Culture: <10000 — AB

## 2021-07-24 LAB — COXSACKIE A VIRUS ANTIBODIES
Coxsackie A16 IgG: NEGATIVE titer
Coxsackie A16 IgM: NEGATIVE titer
Coxsackie A24 IgG: NEGATIVE titer
Coxsackie A24 IgM: NEGATIVE titer
Coxsackie A7 IgG: NEGATIVE titer
Coxsackie A7 IgM: NEGATIVE titer
Coxsackie A9 IgG: NEGATIVE titer
Coxsackie A9 IgM: NEGATIVE titer

## 2021-07-27 LAB — CULTURE, BLOOD (SINGLE)
Culture: NO GROWTH
Special Requests: ADEQUATE

## 2021-08-07 ENCOUNTER — Telehealth: Payer: Self-pay | Admitting: *Deleted

## 2021-08-07 ENCOUNTER — Encounter: Payer: Self-pay | Admitting: Oncology

## 2021-08-07 DIAGNOSIS — C221 Intrahepatic bile duct carcinoma: Secondary | ICD-10-CM

## 2021-08-07 NOTE — Telephone Encounter (Signed)
Called patient at daughter's request re: abdominal discomfort and swelling. Patient admits abdomen is distended, firm and tight w/discomfort. States more swollen than when she needed the paracentesis in March. Also has small marble sized bowel movements several times/day.  Instructed her to start MiraLax 17 grams in 8 ounces of liquid bid and will discuss paracentesis with MD>

## 2021-08-07 NOTE — Telephone Encounter (Signed)
Called patient w/appointment for paracentesis on 10/28 at 0845/0900 to check in in admitting.

## 2021-08-08 ENCOUNTER — Telehealth: Payer: Self-pay

## 2021-08-08 NOTE — Telephone Encounter (Signed)
(  4:55 pm) SW eft a message requesting a call back to schedule a follow-up visit with palliatieve care team.

## 2021-08-10 ENCOUNTER — Ambulatory Visit (HOSPITAL_COMMUNITY)
Admission: RE | Admit: 2021-08-10 | Discharge: 2021-08-10 | Disposition: A | Discharge status: Home / Self Care | Payer: Medicare Other | Source: Ambulatory Visit | Attending: Oncology | Admitting: Oncology

## 2021-08-10 ENCOUNTER — Other Ambulatory Visit: Payer: Self-pay

## 2021-08-10 DIAGNOSIS — C221 Intrahepatic bile duct carcinoma: Secondary | ICD-10-CM | POA: Diagnosis present

## 2021-08-10 MED ORDER — LIDOCAINE HCL 1 % IJ SOLN
INTRAMUSCULAR | Status: AC
  Filled 2021-08-10: qty 20

## 2021-08-10 NOTE — Procedures (Signed)
PROCEDURE SUMMARY:  Successful US guided therapeutic paracentesis from RLQ.Marland Kitchen  Yielded 1.75 L of serosanguinous fluid.  No immediate complications.  Pt tolerated well.   Specimen not sent for labs.  EBL < 1 mL  Tyson Alias, NP 08/10/2021 9:39 AM

## 2021-08-12 ENCOUNTER — Emergency Department (HOSPITAL_COMMUNITY): Payer: Medicare Other

## 2021-08-12 ENCOUNTER — Other Ambulatory Visit: Payer: Self-pay

## 2021-08-12 ENCOUNTER — Emergency Department (HOSPITAL_COMMUNITY)
Admission: EM | Admit: 2021-08-12 | Discharge: 2021-08-12 | Disposition: A | Discharge status: Home / Self Care | Payer: Medicare Other | Attending: Emergency Medicine | Admitting: Emergency Medicine

## 2021-08-12 ENCOUNTER — Encounter (HOSPITAL_COMMUNITY): Payer: Self-pay | Admitting: Emergency Medicine

## 2021-08-12 DIAGNOSIS — Z87891 Personal history of nicotine dependence: Secondary | ICD-10-CM | POA: Insufficient documentation

## 2021-08-12 DIAGNOSIS — Z8505 Personal history of malignant neoplasm of liver: Secondary | ICD-10-CM | POA: Diagnosis not present

## 2021-08-12 DIAGNOSIS — R0602 Shortness of breath: Secondary | ICD-10-CM | POA: Diagnosis not present

## 2021-08-12 DIAGNOSIS — E039 Hypothyroidism, unspecified: Secondary | ICD-10-CM | POA: Diagnosis not present

## 2021-08-12 DIAGNOSIS — Z79899 Other long term (current) drug therapy: Secondary | ICD-10-CM | POA: Diagnosis not present

## 2021-08-12 LAB — CBC WITH DIFFERENTIAL/PLATELET
Abs Immature Granulocytes: 0.05 10*3/uL (ref 0.00–0.07)
Basophils Absolute: 0.1 10*3/uL (ref 0.0–0.1)
Basophils Relative: 1 %
Eosinophils Absolute: 0 10*3/uL (ref 0.0–0.5)
Eosinophils Relative: 0 %
HCT: 37.6 % (ref 36.0–46.0)
Hemoglobin: 12.2 g/dL (ref 12.0–15.0)
Immature Granulocytes: 1 %
Lymphocytes Relative: 12 %
Lymphs Abs: 1.2 10*3/uL (ref 0.7–4.0)
MCH: 31 pg (ref 26.0–34.0)
MCHC: 32.4 g/dL (ref 30.0–36.0)
MCV: 95.4 fL (ref 80.0–100.0)
Monocytes Absolute: 0.9 10*3/uL (ref 0.1–1.0)
Monocytes Relative: 9 %
Neutro Abs: 7.7 10*3/uL (ref 1.7–7.7)
Neutrophils Relative %: 77 %
Platelets: 266 10*3/uL (ref 150–400)
RBC: 3.94 MIL/uL (ref 3.87–5.11)
RDW: 13.5 % (ref 11.5–15.5)
WBC: 10 10*3/uL (ref 4.0–10.5)
nRBC: 0 % (ref 0.0–0.2)

## 2021-08-12 LAB — COMPREHENSIVE METABOLIC PANEL
ALT: 23 U/L (ref 0–44)
AST: 58 U/L — ABNORMAL HIGH (ref 15–41)
Albumin: 2.9 g/dL — ABNORMAL LOW (ref 3.5–5.0)
Alkaline Phosphatase: 284 U/L — ABNORMAL HIGH (ref 38–126)
Anion gap: 9 (ref 5–15)
BUN: 16 mg/dL (ref 8–23)
CO2: 27 mmol/L (ref 22–32)
Calcium: 9.2 mg/dL (ref 8.9–10.3)
Chloride: 99 mmol/L (ref 98–111)
Creatinine, Ser: 0.65 mg/dL (ref 0.44–1.00)
GFR, Estimated: >60 mL/min (ref >60)
Glucose, Bld: 109 mg/dL — ABNORMAL HIGH (ref 70–99)
Potassium: 3.3 mmol/L — ABNORMAL LOW (ref 3.5–5.1)
Sodium: 135 mmol/L (ref 135–145)
Total Bilirubin: 0.9 mg/dL (ref 0.3–1.2)
Total Protein: 6.3 g/dL — ABNORMAL LOW (ref 6.5–8.1)

## 2021-08-12 LAB — BRAIN NATRIURETIC PEPTIDE: B Natriuretic Peptide: 166.4 pg/mL — ABNORMAL HIGH (ref 0.0–100.0)

## 2021-08-12 MED ORDER — MAGNESIUM OXIDE -MG SUPPLEMENT 400 (240 MG) MG PO TABS
800.0000 mg | ORAL_TABLET | Freq: Once | ORAL | Status: AC
  Administered 2021-08-12: 800 mg via ORAL
  Filled 2021-08-12: qty 2

## 2021-08-12 MED ORDER — POTASSIUM CHLORIDE CRYS ER 20 MEQ PO TBCR
40.0000 meq | EXTENDED_RELEASE_TABLET | Freq: Once | ORAL | Status: AC
  Administered 2021-08-12: 40 meq via ORAL
  Filled 2021-08-12: qty 2

## 2021-08-12 NOTE — ED Triage Notes (Signed)
Hospice pt with cholangiocarcinoma reports sudden increase in SOB x 1 hour.  Denies pain. Paracentesis on 10/28 with 1.75 liters of fluid drained.  Denies any other symptoms other than SOB at this time.

## 2021-08-12 NOTE — ED Provider Notes (Signed)
Emergency Medicine Provider Triage Evaluation Note  [name] , a 87 y.o. female  was evaluated in triage.  Pt complains of SOB since yesterday but worsening today. Hx of liver cancer, had 2L drawn off from fluid collection on liver last week. Not currently receiving chemo. No chest pain, nausea, vomiting, diaphoresis.  Review of Systems  Positive: SOB Negative: Chest pain  Physical Exam  BP 123/63 (BP Location: Right Arm)   Pulse 72   Resp (!) 22   SpO2 98%  Gen:   Awake, no distress  Resp:  Some additional effort, normal breath sounds, no wheezing, rhonchi, crackles MSK:   Moves extremities without difficulty  Other:    Medical Decision Making  Medically screening exam initiated at 12:41 PM.  Appropriate orders placed.  Irish Lack was informed that the remainder of the evaluation will be completed by another provider, this initial triage assessment does not replace that evaluation, and the importance of remaining in the ED until their evaluation is complete.  SOB   Anselmo Pickler, [place] 08/12/21 1243    Tegeler, Gwenyth Allegra, MD 08/12/21 [phone]

## 2021-08-12 NOTE — ED Provider Notes (Signed)
[facility] EMERGENCY DEPARTMENT Provider Note   CSN: [id number] Arrival date & time: 08/12/21  1224     History Chief Complaint  Patient presents with   Shortness of Breath    [name] is a 87 y.o. female.   Shortness of Breath Associated symptoms: no abdominal pain, no chest pain, no cough, no diaphoresis, no ear pain, no fever, no headaches, no neck pain, no rash, no sore throat, no vomiting and no wheezing   Patient presents for an episode of shortness of breath that occurred this morning.  She was at home at the time.  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  She is unaware of any inciting events that caused her shortness of breath.  She denies any associated chest discomfort, nausea, diaphoresis, or abdominal pain.  She has not had any recent extremity swelling.  Since onset, her comfort and work of breathing have improved.  Currently, she states that her shortness of breath is very mild and that she feels close to her baseline.  She has had recent visits to the ED.  She was found to have a pericardial effusion on CT imaging 3 weeks ago.  She underwent paracentesis for ascites 2 days ago.  She reports that prior to her symptoms this morning she has been feeling well.    Past Medical History:  Diagnosis Date   Addison's disease ([place])    Hypothyroidism    Osteoporosis     Patient Active Problem List   Diagnosis Date Noted   Pericardial effusion 07/22/2021   Cholangiocarcinoma of liver (Section) 03/27/2021   Goals of care, counseling/discussion 03/27/2021   Elevated LFTs 12/20/2020   Elevated alkaline phosphatase level 12/20/2020   Change in bowel habits 12/20/2020   Abdominal distension (gaseous) 12/20/2020   Lower abdominal pain 12/20/2020    Past Surgical History:  Procedure Laterality Date   ganglion cyst foot     IR FLUORO GUIDE CV LINE RIGHT  03/30/2021   IR IMAGING GUIDED PORT INSERTION  03/30/2021     OB History   No  obstetric history on file.     Family History  Problem Relation Age of Onset   Lung cancer Father    Angina Father    Stroke Mother    Hypertension Brother    Ovarian cancer Sister     Social History   Tobacco Use   Smoking status: Former    Packs/day: 0.50    Years: 7.00    Pack years: 3.50    Types: Cigarettes    Quit date: 01/30/1979    Years since quitting: 42.5   Smokeless tobacco: Never  Vaping Use   Vaping Use: Never used  Substance Use Topics   Alcohol use: Yes    Alcohol/week: 1.0 standard drink    Types: 1 Glasses of wine per week   Drug use: Never    Home Medications Prior to Admission medications   Medication Sig Start Date End Date Taking? Authorizing Provider  calcium carbonate (TUMS - DOSED IN MG ELEMENTAL CALCIUM) 500 MG chewable tablet Chew 500 mg by mouth daily as needed for indigestion or heartburn.    [provider]  Calcium Citrate-Vitamin D (CALCIUM + D PO) Take 1 tablet by mouth daily.    [provider]  Cholecalciferol (VITAMIN D) 50 MCG (2000 UT) tablet Take 2,000 Units by mouth daily.    [provider]  fludrocortisone (FLORINEF) 0.1 MG tablet Take 0.05 mg by mouth daily.  [provider]  hydrocortisone (CORTEF) 10 MG tablet Take 5-10 mg by mouth See admin instructions. Take 7.5mg  at 7 am, 5 mg at noon, 5 mg at 3pm    [provider]  KLOR-CON M20 20 MEQ tablet TAKE 1 TABLET BY MOUTH TWICE A DAY 06/14/21   Ladell Pier, MD  levothyroxine (SYNTHROID) 100 MCG tablet Take 100 mcg by mouth every morning. 05/10/21   [provider]  levothyroxine (SYNTHROID, LEVOTHROID) 88 MCG tablet Take 88 mcg by mouth daily.    [provider]  lidocaine-prilocaine (EMLA) cream Apply 1 application topically as directed. Apply 1/2 tablespoon to port site 2 hours prior to stick and cover with press-and-seal or plastic wrap 04/06/21   Ladell Pier, MD  ondansetron (ZOFRAN) 8 MG tablet Take 1  tablet (8 mg total) by mouth every 8 (eight) hours as needed for nausea or vomiting. Start 72 hours after Day 1 of chemo cycle 04/06/21   Ladell Pier, MD  oxyCODONE (OXY IR/ROXICODONE) 5 MG immediate release tablet Take 1 tablet (5 mg total) by mouth every 6 (six) hours as needed for severe pain. 07/22/21   Isla Pence, MD  prochlorperazine (COMPAZINE) 5 MG tablet Take 1 tablet (5 mg total) by mouth every 6 (six) hours as needed for nausea or vomiting. 04/06/21   Ladell Pier, MD  Wheat Dextrin (BENEFIBER PO) Take 3 tablets by mouth daily as needed (constipation).    [provider]    Allergies    Patient has no known allergies.  Review of Systems   Review of Systems  Constitutional:  Negative for activity change, chills, diaphoresis, fatigue and fever.  HENT:  Negative for congestion, ear pain and sore throat.   Eyes:  Negative for pain and visual disturbance.  Respiratory:  Positive for shortness of breath. Negative for cough, wheezing and stridor.   Cardiovascular:  Negative for chest pain, palpitations and leg swelling.  Gastrointestinal:  Negative for abdominal pain, nausea and vomiting.  Genitourinary:  Negative for dysuria, flank pain, hematuria and pelvic pain.  Musculoskeletal:  Negative for arthralgias, back pain, myalgias and neck pain.  Skin:  Negative for color change and rash.  Neurological:  Negative for dizziness, seizures, syncope, light-headedness and headaches.  Hematological:  Does not bruise/bleed easily.  Psychiatric/Behavioral:  Negative for confusion and decreased concentration.   All other systems reviewed and are negative.  Physical Exam Updated Vital Signs BP 134/79   Pulse 76   Temp 98.4 F (36.9 C) (Oral)   Resp 16   SpO2 97%   Physical Exam Vitals and nursing note reviewed.  Constitutional:      General: She is not in acute distress.    Appearance: She is well-developed. She is not ill-appearing, toxic-appearing or diaphoretic.   HENT:     Head: Normocephalic and atraumatic.     Mouth/Throat:     Mouth: Mucous membranes are moist.     Pharynx: Oropharynx is clear.  Eyes:     Extraocular Movements: Extraocular movements intact.     Conjunctiva/sclera: Conjunctivae normal.  Neck:     Vascular: JVD (Mild) present.  Cardiovascular:     Rate and Rhythm: Normal rate and regular rhythm.     Heart sounds: No murmur heard. Pulmonary:     Effort: Pulmonary effort is normal. No tachypnea, accessory muscle usage or respiratory distress.     Breath sounds: Examination of the right-lower field reveals rales. Examination of the left-lower field reveals rales.  Rales present. No decreased breath sounds, wheezing or rhonchi.  Chest:     Chest wall: No tenderness.  Abdominal:     Palpations: Abdomen is soft.     Tenderness: There is no abdominal tenderness.  Musculoskeletal:     Cervical back: Normal range of motion and neck supple.     Right lower leg: No edema.     Left lower leg: No edema.  Skin:    General: Skin is warm and dry.     Coloration: Skin is not pale.  Neurological:     General: No focal deficit present.     Mental Status: She is alert and oriented to person, place, and time.     Cranial Nerves: No cranial nerve deficit.     Motor: No weakness.  Psychiatric:        Mood and Affect: Mood normal.        Behavior: Behavior normal.    ED Results / Procedures / Treatments   Labs (all labs ordered are listed, but only abnormal results are displayed) Labs Reviewed  COMPREHENSIVE METABOLIC PANEL - Abnormal; Notable for the following components:      Result Value   Potassium 3.3 (*)    Glucose, Bld 109 (*)    Total Protein 6.3 (*)    Albumin 2.9 (*)    AST 58 (*)    Alkaline Phosphatase 284 (*)    All other components within normal limits  BRAIN NATRIURETIC PEPTIDE - Abnormal; Notable for the following components:   B Natriuretic Peptide 166.4 (*)    All other components within normal limits  CBC  WITH DIFFERENTIAL/PLATELET    EKG None  Radiology DG Chest 2 View  Result Date: 08/12/2021 CLINICAL DATA:  Acute shortness of breath.  Metastatic liver cancer. EXAM: CHEST - 2 VIEW COMPARISON:  07/22/2021 FINDINGS: Cardiomegaly and RIGHT-sided Port-A-Cath again noted. There is no evidence of focal airspace disease, pulmonary edema, suspicious pulmonary nodule/mass, pleural effusion, or pneumothorax. No acute bony abnormalities are identified. IMPRESSION: Cardiomegaly without evidence of acute cardiopulmonary disease. Electronically Signed   By: Margarette Canada M.D.   On: 08/12/2021 13:59    Procedures Procedures   Medications Ordered in ED Medications  potassium chloride SA (KLOR-CON) CR tablet 40 mEq (40 mEq Oral Given 08/12/21 1516)  magnesium oxide (MAG-OX) tablet 800 mg (800 mg Oral Given 08/12/21 1516)    ED Course  I have reviewed the triage vital signs and the nursing notes.  Pertinent labs & imaging results that were available during my care of the patient were reviewed by me and considered in my medical decision making (see chart for details).    MDM Rules/Calculators/A&P                          Patient is 87 year old female with cholangiocarcinoma, currently living at home on hospice, presenting for an episode of shortness of breath this morning.  Since this episode, she has had continued improvement and currently feels close to baseline.  On exam, patient sitting in wheelchair, resting comfortably.  She does not have any visible increased work of breathing.  She is on room air.  On auscultation of her lungs, she does have bibasilar crackles but no evidence of wheezing or rhonchi.  Patient, at this time, is requesting to go home.  Her granddaughter, who accompanies her in the ED, does encourage her to stay for results of work-up, which was initiated prior to being  bedded in the ED.  EKG shows LBBB with no changes from prior studies.  Lab work shows slightly low potassium.  BNP is  decreased from 3 weeks ago.  Patient's hemoglobin is baseline and she has no leukocytosis.  Potassium and magnesium were given to optimize electrolyte levels.  On reassessment, patient continued to report comfortable breathing and request to go home.  She was encouraged to return to the anytime for any recurrence of symptoms.  She was discharged in stable condition. Final Clinical Impression(s) / ED Diagnoses Final diagnoses:  SOB (shortness of breath)    Rx / DC Orders ED Discharge Orders     None        Godfrey Pick, MD 08/12/21 [place]

## 2021-08-12 NOTE — ED Notes (Signed)
Patient transported to X-ray 

## 2021-08-13 ENCOUNTER — Inpatient Hospital Stay (HOSPITAL_BASED_OUTPATIENT_CLINIC_OR_DEPARTMENT_OTHER): Payer: Medicare Other | Admitting: Oncology

## 2021-08-13 ENCOUNTER — Other Ambulatory Visit: Payer: Medicare Other

## 2021-08-13 ENCOUNTER — Inpatient Hospital Stay: Payer: Medicare Other | Attending: Nurse Practitioner

## 2021-08-13 ENCOUNTER — Other Ambulatory Visit: Payer: Self-pay

## 2021-08-13 VITALS — BP 118/53 | HR 77 | Temp 98.1°F | Resp 18 | Ht 66.0 in | Wt 127.6 lb

## 2021-08-13 DIAGNOSIS — E271 Primary adrenocortical insufficiency: Secondary | ICD-10-CM | POA: Insufficient documentation

## 2021-08-13 DIAGNOSIS — C221 Intrahepatic bile duct carcinoma: Secondary | ICD-10-CM

## 2021-08-13 DIAGNOSIS — Z79899 Other long term (current) drug therapy: Secondary | ICD-10-CM | POA: Insufficient documentation

## 2021-08-13 DIAGNOSIS — R634 Abnormal weight loss: Secondary | ICD-10-CM | POA: Diagnosis not present

## 2021-08-13 DIAGNOSIS — Z8041 Family history of malignant neoplasm of ovary: Secondary | ICD-10-CM | POA: Insufficient documentation

## 2021-08-13 DIAGNOSIS — E039 Hypothyroidism, unspecified: Secondary | ICD-10-CM | POA: Diagnosis not present

## 2021-08-13 DIAGNOSIS — Z23 Encounter for immunization: Secondary | ICD-10-CM | POA: Diagnosis not present

## 2021-08-13 DIAGNOSIS — Z95828 Presence of other vascular implants and grafts: Secondary | ICD-10-CM

## 2021-08-13 MED ORDER — INFLUENZA VAC A&B SA ADJ QUAD 0.5 ML IM PRSY
0.5000 mL | PREFILLED_SYRINGE | Freq: Once | INTRAMUSCULAR | Status: AC
  Administered 2021-08-13: 0.5 mL via INTRAMUSCULAR
  Filled 2021-08-13: qty 0.5

## 2021-08-13 MED ORDER — HEPARIN SOD (PORK) LOCK FLUSH 100 UNIT/ML IV SOLN
500.0000 [IU] | Freq: Once | INTRAVENOUS | Status: AC
  Administered 2021-08-13: 500 [IU] via INTRAVENOUS

## 2021-08-13 MED ORDER — SODIUM CHLORIDE 0.9% FLUSH
10.0000 mL | Freq: Once | INTRAVENOUS | Status: AC
  Administered 2021-08-13: 10 mL via INTRAVENOUS

## 2021-08-13 NOTE — Progress Notes (Signed)
[place] OFFICE PROGRESS NOTE   Diagnosis: Cholangiocarcinoma  INTERVAL HISTORY:   [name] returns as scheduled.  She is here with her daughter.  She is enrolled in home palliative care.  She developed increased abdominal distention and underwent a therapeutic paracentesis on 08/02/2021 for 1.75 L of fluid.  She felt better after the paracentesis.  She reports dyspnea.  She was seen in the emergency room yesterday and there was no evidence of acute cardiopulmonary disease.  A CT chest on 07/22/2021 revealed no evidence of pulmonary embolism.  She denies cough and fever.  She feels better today.  Objective:  Vital signs in last 24 hours:  Blood pressure (!) 118/53, pulse 77, temperature 98.1 F (36.7 C), temperature source Oral, resp. rate 18, height 5' 6"  (1.676 m), weight 127 lb 9.6 oz (57.9 kg), SpO2 98 %.     Resp: Lungs clear bilaterally Cardio: Regular rate and rhythm GI: Fullness in the right upper abdomen, the abdomen is soft Vascular: No leg edema   Portacath/PICC-without erythema  Lab Results:  Lab Results  Component Value Date   WBC 10.0 08/12/2021   HGB 12.2 08/12/2021   HCT 37.6 08/12/2021   MCV 95.4 08/12/2021   PLT 266 08/12/2021   NEUTROABS 7.7 08/12/2021    CMP  Lab Results  Component Value Date   NA 135 08/12/2021   K 3.3 (L) 08/12/2021   CL 99 08/12/2021   CO2 27 08/12/2021   GLUCOSE 109 (H) 08/12/2021   BUN 16 08/12/2021   CREATININE 0.65 08/12/2021   CALCIUM 9.2 08/12/2021   PROT 6.3 (L) 08/12/2021   ALBUMIN 2.9 (L) 08/12/2021   AST 58 (H) 08/12/2021   ALT 23 08/12/2021   ALKPHOS 284 (H) 08/12/2021   BILITOT 0.9 08/12/2021   GFRNONAA >60 08/12/2021   GFRAA 64 (L) 11/03/2012    No results found for: CEA1, CEA, [id number], CA125  Lab Results  Component Value Date   INR 0.9 07/22/2021   LABPROT 12.3 07/22/2021    Imaging:  DG Chest 2 View  Result Date: 08/12/2021 CLINICAL DATA:  Acute shortness of breath.   Metastatic liver cancer. EXAM: CHEST - 2 VIEW COMPARISON:  07/22/2021 FINDINGS: Cardiomegaly and RIGHT-sided Port-A-Cath again noted. There is no evidence of focal airspace disease, pulmonary edema, suspicious pulmonary nodule/mass, pleural effusion, or pneumothorax. No acute bony abnormalities are identified. IMPRESSION: Cardiomegaly without evidence of acute cardiopulmonary disease. Electronically Signed   By: Margarette Canada M.D.   On: 08/12/2021 13:59   US Paracentesis  Result Date: 08/10/2021 INDICATION: Patient with history of hepatic masses. Request for therapeutic paracentesis. EXAM: ULTRASOUND GUIDED THERAPEUTIC PARACENTESIS MEDICATIONS: 10 mL 1% lidocaine COMPLICATIONS: None immediate. PROCEDURE: Informed written consent was obtained from the patient after a discussion of the risks, benefits and alternatives to treatment. A timeout was performed prior to the initiation of the procedure. Initial ultrasound scanning demonstrates a moderate amount of ascites within the right lower abdominal quadrant. The right lower abdomen was prepped and draped in the usual sterile fashion. 1% lidocaine was used for local anesthesia. Following this, a 19 gauge, 7-cm, Yueh catheter was introduced. An ultrasound image was saved for documentation purposes. The paracentesis was performed. The catheter was removed and a dressing was applied. The patient tolerated the procedure well without immediate post procedural complication. FINDINGS: A total of approximately 1.75 L of serosanguineous fluid was removed. IMPRESSION: Successful ultrasound-guided paracentesis yielding 1.75 liters of peritoneal fluid. Read by: Narda Rutherford, NP Electronically Signed  By: Ruthann Cancer M.D.   On: 08/10/2021 09:46    Medications: I have reviewed the patient's current medications.   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 CT abdomen/pelvis 12/21/2020-large right liver mass with peripheral  intrahepatic biliary duct dilatation in the right liver, expansile tumor thrombus occluding the main, left, and right portal veins, mild porta hepatis lymphadenopathy, indeterminate 2 cm hypodense right renal lesion, small volume ascites MRI abdomen 12/30/2020-9.4 x 6.8 cm liver mass centered in segment 5 with involvement of segments 4A and 8, peripheral intrahepatic biliary ductal dilatation, expansile occlusive tumor thrombus in the main, left, right portal veins, mass characterized as LR-M, solitary 0.9 cm segment 2 lesion suspicious for metastasis, mildly enlarged portacaval node, small volume ascites, right renal cortical mass-renal cell carcinoma not excluded Ultrasound-guided biopsy of right liver mass on 01/09/2021-pathology refer to [facility]- carcinoma with predominantly glandular and small foci of hepatocellular differentiation cholangiocarcinoma favored, NGS-IDH1 alteration, MSS, tumor mutation burden 3 Cycle 1 gemcitabine/cisplatin 04/13/2021, day 8 04/19/2021 Cycle 2 gemcitabine/cisplatin 05/02/2021, treatment changed to an every 2-week schedule Gemcitabine alone 05/02/2021 Chemotherapy discontinued per patient preference CT abdomen/pelvis 07/22/2021-moderate ascites, enlargement of ill marginated liver mass occupying the right liver and extending to the caudate with tumor extension into the hepatic veins and right portal vein Therapeutic paracentesis 08/02/2021   Addison's disease Anorexia/weight loss secondary to #1 Family history of ovarian cancer Hypothyroidism Intermittent diarrhea-related to the right liver mass? Report of intermittent small-volume rectal bleeding Severe fatigue/malaise, question related to cisplatin/chemotherapy versus cancer-improved      Disposition: [name] has a history of cholangiocarcinoma.  There is clinical and radiologic evidence of disease progression.  She has enrolled in home palliative care.  I recommend transitioning to full hospice care.  She agrees.   We discussed CPR and ACLS.  She will be placed on no CODE BLUE status.  She will call if the ascites progresses and we we will arrange for another therapeutic paracentesis.  The dyspnea is most like related to deconditioning and difficulty with chest expansion due to the liver tumor and ascites.  [name] will return for an office visit in 3-4 weeks.  She will receive an influenza vaccine today.  Betsy Coder, MD  08/13/2021  12:32 PM

## 2021-08-15 ENCOUNTER — Encounter: Payer: Self-pay | Admitting: Oncology

## 2021-08-16 ENCOUNTER — Telehealth: Payer: Self-pay

## 2021-08-16 ENCOUNTER — Encounter: Payer: Self-pay | Admitting: Oncology

## 2021-08-16 NOTE — Telephone Encounter (Signed)
TC to Pt responding to my chart message. Pt  Hello - we meant to ask this when we were in Monday - is it an option to have a tube put in so the fluid can be drained at home since it may need to drawn off more often.    Also, the fluid is building up again and she is having discomfort with pressure and breathing so we would like to ask if an appointment can be made to have the fluid drawn off again. Thank you..... Message sent to Dr Benay Spice who stated he did not recommend this for the patient. Spoke with Pt's daughter who verbalized understanding. Also asked if she can go get fluid removed. Will discuss with Dr Benay Spice in the morning.

## 2021-08-17 ENCOUNTER — Other Ambulatory Visit: Payer: Self-pay | Admitting: *Deleted

## 2021-08-17 DIAGNOSIS — C221 Intrahepatic bile duct carcinoma: Secondary | ICD-10-CM

## 2021-08-17 NOTE — Progress Notes (Signed)
Per Dr. Benay Spice: OK to order paracentesis 1st available. Scheduled at [facility] 11/07 at 0945/1000. Daughter notified.

## 2021-08-20 ENCOUNTER — Other Ambulatory Visit: Payer: Self-pay | Admitting: Oncology

## 2021-08-20 ENCOUNTER — Ambulatory Visit (HOSPITAL_COMMUNITY)
Admission: RE | Admit: 2021-08-20 | Discharge: 2021-08-20 | Disposition: A | Discharge status: Home / Self Care | Payer: Medicare Other | Source: Ambulatory Visit | Attending: Oncology | Admitting: Oncology

## 2021-08-20 ENCOUNTER — Other Ambulatory Visit: Payer: Self-pay

## 2021-08-20 DIAGNOSIS — C221 Intrahepatic bile duct carcinoma: Secondary | ICD-10-CM

## 2021-08-20 MED ORDER — LIDOCAINE HCL 1 % IJ SOLN
INTRAMUSCULAR | Status: AC
  Filled 2021-08-20: qty 20

## 2021-08-20 NOTE — Progress Notes (Signed)
Pt was brought to [place] for paracentesis. Upon US examination, there was no fluid on L and a very small fluid pocket on R that was not large enough to safely access.  Exam was ended and explained to patient.  Patient was in agreement with findings.    Narda Rutherford, AGNP-BC 08/20/2021, 10:21 AM

## 2021-08-23 ENCOUNTER — Other Ambulatory Visit: Payer: Self-pay

## 2021-08-23 ENCOUNTER — Encounter: Payer: Self-pay | Admitting: Oncology

## 2021-08-23 DIAGNOSIS — C221 Intrahepatic bile duct carcinoma: Secondary | ICD-10-CM

## 2021-08-31 ENCOUNTER — Other Ambulatory Visit: Payer: Self-pay | Admitting: Oncology

## 2021-08-31 ENCOUNTER — Ambulatory Visit (HOSPITAL_COMMUNITY)
Admission: RE | Admit: 2021-08-31 | Discharge: 2021-08-31 | Disposition: A | Discharge status: Home / Self Care | Source: Ambulatory Visit | Attending: Oncology | Admitting: Oncology

## 2021-08-31 ENCOUNTER — Other Ambulatory Visit: Payer: Self-pay

## 2021-08-31 DIAGNOSIS — C221 Intrahepatic bile duct carcinoma: Secondary | ICD-10-CM | POA: Insufficient documentation

## 2021-08-31 MED ORDER — LIDOCAINE HCL 1 % IJ SOLN
INTRAMUSCULAR | Status: AC
  Filled 2021-08-31: qty 20

## 2021-08-31 NOTE — Progress Notes (Signed)
Patient presents for therapeutic paracentesis. [place] limited  shows trace amount of peritoneal fluid noted  Insufficient to perform a safe paracentesis. Procedure not performed.

## 2021-09-04 ENCOUNTER — Encounter: Payer: Self-pay | Admitting: Oncology

## 2021-09-04 ENCOUNTER — Telehealth: Payer: Self-pay

## 2021-09-04 NOTE — Telephone Encounter (Signed)
Spoke to Pt's daughter. Pt scheduled for Para at [facility] 09/05/21 at 12 . Pt to arrive at 1130. Daughter aware.

## 2021-09-05 ENCOUNTER — Ambulatory Visit (HOSPITAL_COMMUNITY): Payer: Medicare Other

## 2021-09-05 ENCOUNTER — Other Ambulatory Visit: Payer: Self-pay

## 2021-09-05 ENCOUNTER — Ambulatory Visit (HOSPITAL_COMMUNITY)
Admission: RE | Admit: 2021-09-05 | Discharge: 2021-09-05 | Disposition: A | Discharge status: Home / Self Care | Source: Ambulatory Visit | Attending: Oncology | Admitting: Oncology

## 2021-09-05 DIAGNOSIS — C221 Intrahepatic bile duct carcinoma: Secondary | ICD-10-CM | POA: Insufficient documentation

## 2021-09-05 DIAGNOSIS — R188 Other ascites: Secondary | ICD-10-CM | POA: Diagnosis not present

## 2021-09-05 HISTORY — PX: IR PARACENTESIS: IMG2679

## 2021-09-05 MED ORDER — LIDOCAINE HCL (PF) 1 % IJ SOLN
INTRAMUSCULAR | Status: DC | PRN
  Administered 2021-09-05: 10 mL

## 2021-09-05 MED ORDER — LIDOCAINE HCL 1 % IJ SOLN
INTRAMUSCULAR | Status: AC
  Filled 2021-09-05: qty 20

## 2021-09-05 NOTE — Procedures (Signed)
PROCEDURE SUMMARY:  Successful US guided therapeutic paracentesis from RLQ. Yielded 2.5 L of serosanguinous fluid.  No immediate complications.  Pt tolerated well.   Specimen not sent for labs.  EBL < 1 mL  Tyson Alias, AGNP 09/05/2021 12:39 PM

## 2021-09-11 ENCOUNTER — Inpatient Hospital Stay: Attending: Nurse Practitioner | Admitting: Oncology

## 2021-09-11 ENCOUNTER — Other Ambulatory Visit: Payer: Self-pay

## 2021-09-11 ENCOUNTER — Inpatient Hospital Stay

## 2021-09-11 VITALS — BP 118/62 | HR 75 | Temp 97.7°F | Resp 16 | Ht 66.0 in | Wt 126.2 lb

## 2021-09-11 DIAGNOSIS — C221 Intrahepatic bile duct carcinoma: Secondary | ICD-10-CM

## 2021-09-11 DIAGNOSIS — Z95828 Presence of other vascular implants and grafts: Secondary | ICD-10-CM

## 2021-09-11 DIAGNOSIS — Z452 Encounter for adjustment and management of vascular access device: Secondary | ICD-10-CM | POA: Diagnosis not present

## 2021-09-11 MED ORDER — HEPARIN SOD (PORK) LOCK FLUSH 100 UNIT/ML IV SOLN
500.0000 [IU] | Freq: Once | INTRAVENOUS | Status: AC
  Administered 2021-09-11: 500 [IU] via INTRAVENOUS

## 2021-09-11 MED ORDER — POTASSIUM CHLORIDE CRYS ER 20 MEQ PO TBCR: 20.0000 meq | EXTENDED_RELEASE_TABLET | Freq: Two times a day (BID) | ORAL | 0 refills | Status: DC

## 2021-09-11 MED ORDER — SODIUM CHLORIDE 0.9% FLUSH
10.0000 mL | Freq: Once | INTRAVENOUS | Status: AC
  Administered 2021-09-11: 10 mL via INTRAVENOUS

## 2021-09-11 NOTE — Progress Notes (Signed)
[place] OFFICE PROGRESS NOTE   Diagnosis: Cholangiocarcinoma  INTERVAL HISTORY:   Ms. [name] returns as scheduled.  She is enrolled in home hospice care.  No pain.  The abdomen became more distended over the past few weeks.  She underwent a therapeutic paracentesis 09/05/2021 for 2.5 L.  She felt better after the procedure.  Objective:  Vital signs in last 24 hours:  Blood pressure 118/62, pulse 75, temperature 97.7 F (36.5 C), temperature source Oral, resp. rate 16, height _0  (1.676 m), weight 126 lb 3.2 oz (57.2 kg), SpO2 100 %.   Resp: Lungs clear bilaterally Cardio: Regular rate and rhythm GI: Mildly distended with ascites, the liver edge is palpable in the right upper abdomen, nontender Vascular: No leg edema  Lab Results:  Lab Results  Component Value Date   WBC 10.0 08/12/2021   HGB 12.2 08/12/2021   HCT 37.6 08/12/2021   MCV 95.4 08/12/2021   PLT 266 08/12/2021   NEUTROABS 7.7 08/12/2021    CMP  Lab Results  Component Value Date   NA 135 08/12/2021   K 3.3 (L) 08/12/2021   CL 99 08/12/2021   CO2 27 08/12/2021   GLUCOSE 109 (H) 08/12/2021   BUN 16 08/12/2021   CREATININE 0.65 08/12/2021   CALCIUM 9.2 08/12/2021   PROT 6.3 (L) 08/12/2021   ALBUMIN 2.9 (L) 08/12/2021   AST 58 (H) 08/12/2021   ALT 23 08/12/2021   ALKPHOS 284 (H) 08/12/2021   BILITOT 0.9 08/12/2021   GFRNONAA >60 08/12/2021   GFRAA 64 (L) 11/03/2012    No results found for: CEA1, CEA, [id number], CA125  Lab Results  Component Value Date   INR 0.9 07/22/2021   LABPROT 12.3 07/22/2021    Imaging:  No results found.  Medications: I have reviewed the patient's current medications.   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 CT abdomen/pelvis 12/21/2020-large right liver mass with peripheral intrahepatic biliary duct dilatation in the right liver, expansile tumor thrombus occluding the main, left, and right  portal veins, mild porta hepatis lymphadenopathy, indeterminate 2 cm hypodense right renal lesion, small volume ascites MRI abdomen 12/30/2020-9.4 x 6.8 cm liver mass centered in segment 5 with involvement of segments 4A and 8, peripheral intrahepatic biliary ductal dilatation, expansile occlusive tumor thrombus in the main, left, right portal veins, mass characterized as LR-M, solitary 0.9 cm segment 2 lesion suspicious for metastasis, mildly enlarged portacaval node, small volume ascites, right renal cortical mass-renal cell carcinoma not excluded Ultrasound-guided biopsy of right liver mass on 01/09/2021-pathology refer to [facility]- carcinoma with predominantly glandular and small foci of hepatocellular differentiation cholangiocarcinoma favored, NGS-IDH1 alteration, MSS, tumor mutation burden 3 Cycle 1 gemcitabine/cisplatin 04/13/2021, day 8 04/19/2021 Cycle 2 gemcitabine/cisplatin 05/02/2021, treatment changed to an every 2-week schedule Gemcitabine alone 05/02/2021 Chemotherapy discontinued per patient preference CT abdomen/pelvis 07/22/2021-moderate ascites, enlargement of ill marginated liver mass occupying the right liver and extending to the caudate with tumor extension into the hepatic veins and right portal vein Therapeutic paracentesis 08/02/2021, 08/10/2021, 09/05/2021   Addison's disease Anorexia/weight loss secondary to #1 Family history of ovarian cancer Hypothyroidism Intermittent diarrhea-related to the right liver mass? Report of intermittent small-volume rectal bleeding Severe fatigue/malaise, question related to cisplatin/chemotherapy versus cancer-improved    Disposition: [name] has cholangiocarcinoma.  Her performance status appears to be slowly declining.  She is enrolled in home hospice care.  She will contact us to schedule a palliative paracentesis if she develops increased ascites.  She  would like to continue follow-up with Cancer center.  [name] will return for an  office visit and Port-A-Cath flush in approximately 5 weeks.  Betsy Coder, MD  09/11/2021  12:01 PM

## 2021-09-18 ENCOUNTER — Encounter: Payer: Self-pay | Admitting: Oncology

## 2021-09-19 ENCOUNTER — Other Ambulatory Visit: Payer: Self-pay

## 2021-09-19 ENCOUNTER — Other Ambulatory Visit: Payer: Self-pay | Admitting: *Deleted

## 2021-09-19 DIAGNOSIS — C221 Intrahepatic bile duct carcinoma: Secondary | ICD-10-CM

## 2021-09-19 NOTE — Progress Notes (Signed)
Para/thora to be scheduled for 12/15 in the early morning per pt request.

## 2021-09-27 ENCOUNTER — Ambulatory Visit (HOSPITAL_COMMUNITY)
Admission: RE | Admit: 2021-09-27 | Discharge: 2021-09-27 | Disposition: A | Discharge status: Home / Self Care | Source: Ambulatory Visit | Attending: Oncology | Admitting: Oncology

## 2021-09-27 ENCOUNTER — Other Ambulatory Visit: Payer: Self-pay

## 2021-09-27 DIAGNOSIS — C221 Intrahepatic bile duct carcinoma: Secondary | ICD-10-CM | POA: Insufficient documentation

## 2021-09-27 MED ORDER — LIDOCAINE HCL 1 % IJ SOLN
INTRAMUSCULAR | Status: AC
  Administered 2021-09-27: 10 mL
  Filled 2021-09-27: qty 20

## 2021-09-27 MED ORDER — LIDOCAINE HCL 1 % IJ SOLN
INTRAMUSCULAR | Status: AC
  Filled 2021-09-27: qty 20

## 2021-09-27 NOTE — Procedures (Signed)
PROCEDURE SUMMARY:  Successful image-guided paracentesis from the right lower abdomen.  Yielded 1.9 liters of clear yellow fluid.  No immediate complications.  EBL = trace. Patient tolerated well.   Specimen was not sent for labs.  Please see imaging section of Epic for full dictation.   Armando Gang Yossef Gilkison PA-C 09/27/2021 12:54 PM

## 2021-10-05 ENCOUNTER — Encounter: Payer: Self-pay | Admitting: Oncology

## 2021-10-05 ENCOUNTER — Telehealth: Payer: Self-pay | Admitting: Oncology

## 2021-10-05 NOTE — Telephone Encounter (Signed)
Daughter reached out via Mychart to reschedule appt from 1/5 to 1/4. Reached out to daughter and got appt rescheduled for 1/4.

## 2021-10-09 ENCOUNTER — Telehealth: Payer: Self-pay

## 2021-10-09 ENCOUNTER — Encounter: Payer: Self-pay | Admitting: Oncology

## 2021-10-09 ENCOUNTER — Other Ambulatory Visit: Payer: Self-pay

## 2021-10-09 DIAGNOSIS — C221 Intrahepatic bile duct carcinoma: Secondary | ICD-10-CM

## 2021-10-09 NOTE — Telephone Encounter (Signed)
Daughter reached out via My chart to schedule appointment for paracentesis. Called and spoke with the daughter to inform her the appointment for the paracentesis is  schedule at 9:30 [facility]. Daughter voiced understanding

## 2021-10-11 ENCOUNTER — Ambulatory Visit (HOSPITAL_COMMUNITY)
Admission: RE | Admit: 2021-10-11 | Discharge: 2021-10-11 | Disposition: A | Discharge status: Home / Self Care | Source: Ambulatory Visit | Attending: Oncology | Admitting: Oncology

## 2021-10-11 ENCOUNTER — Other Ambulatory Visit: Payer: Self-pay

## 2021-10-11 ENCOUNTER — Encounter: Payer: Self-pay | Admitting: Oncology

## 2021-10-11 DIAGNOSIS — R188 Other ascites: Secondary | ICD-10-CM | POA: Insufficient documentation

## 2021-10-11 DIAGNOSIS — C221 Intrahepatic bile duct carcinoma: Secondary | ICD-10-CM | POA: Insufficient documentation

## 2021-10-11 HISTORY — PX: IR PARACENTESIS: IMG2679

## 2021-10-11 MED ORDER — LIDOCAINE HCL (PF) 1 % IJ SOLN
INTRAMUSCULAR | Status: DC | PRN
  Administered 2021-10-11: 5 mL

## 2021-10-11 MED ORDER — LIDOCAINE HCL 1 % IJ SOLN
INTRAMUSCULAR | Status: AC
  Filled 2021-10-11: qty 20

## 2021-10-11 NOTE — Procedures (Signed)
PROCEDURE SUMMARY:  Successful US guided paracentesis from left lateral abdomen.  Yielded 1.7 liters of yellow fluid.  No immediate complications.  Pt tolerated well.   Specimen was not sent for labs.  EBL < 1mL  Docia Barrier PA-C 10/11/2021 11:46 AM

## 2021-10-16 ENCOUNTER — Other Ambulatory Visit: Payer: Self-pay | Admitting: *Deleted

## 2021-10-16 ENCOUNTER — Encounter: Payer: Self-pay | Admitting: *Deleted

## 2021-10-16 NOTE — Progress Notes (Signed)
Scheduled paracentesis per patient's daughter request and notified via [place].

## 2021-10-17 ENCOUNTER — Encounter: Payer: Self-pay | Admitting: Nurse Practitioner

## 2021-10-17 ENCOUNTER — Inpatient Hospital Stay (HOSPITAL_BASED_OUTPATIENT_CLINIC_OR_DEPARTMENT_OTHER): Payer: Medicare Other | Admitting: Nurse Practitioner

## 2021-10-17 ENCOUNTER — Telehealth: Payer: Self-pay

## 2021-10-17 ENCOUNTER — Other Ambulatory Visit: Payer: Self-pay

## 2021-10-17 ENCOUNTER — Inpatient Hospital Stay: Payer: Medicare Other | Attending: Nurse Practitioner

## 2021-10-17 VITALS — BP 116/56 | HR 81 | Temp 98.1°F | Resp 18 | Ht 66.0 in | Wt 132.0 lb

## 2021-10-17 DIAGNOSIS — Z9221 Personal history of antineoplastic chemotherapy: Secondary | ICD-10-CM | POA: Insufficient documentation

## 2021-10-17 DIAGNOSIS — C221 Intrahepatic bile duct carcinoma: Secondary | ICD-10-CM

## 2021-10-17 DIAGNOSIS — E271 Primary adrenocortical insufficiency: Secondary | ICD-10-CM | POA: Insufficient documentation

## 2021-10-17 DIAGNOSIS — E039 Hypothyroidism, unspecified: Secondary | ICD-10-CM | POA: Insufficient documentation

## 2021-10-17 DIAGNOSIS — Z8041 Family history of malignant neoplasm of ovary: Secondary | ICD-10-CM | POA: Insufficient documentation

## 2021-10-17 DIAGNOSIS — Z452 Encounter for adjustment and management of vascular access device: Secondary | ICD-10-CM | POA: Diagnosis present

## 2021-10-17 NOTE — Telephone Encounter (Signed)
Called and spoke with the patient to confirm paracentesis at [facility] at 930

## 2021-10-17 NOTE — Progress Notes (Signed)
[place] OFFICE PROGRESS NOTE   Diagnosis: Cholangiocarcinoma  INTERVAL HISTORY:   [name] returns as scheduled.  She is enrolled in home hospice care.  Most recent paracentesis 10/11/2021, 1.7 L of fluid removed.  She feels much better following a paracentesis procedure, specifically improvement in energy and breathing.  She has a good appetite.  No abdominal pain.  She feels she needs another paracentesis in the next few days.  Objective:  Vital signs in last 24 hours:  Blood pressure (!) 116/56, pulse 81, temperature 98.1 F (36.7 C), temperature source Oral, resp. rate 18, height $RemoveBe'5\' 6"'pSDjnMAWv$  (1.676 m), weight 132 lb (59.9 kg), SpO2 98 %.    HEENT: No thrush or ulcers. Resp: Lungs clear bilaterally. Cardio: Regular rate and rhythm. GI: Abdomen distended, consistent with ascites. Vascular: No leg edema. Port-A-Cath without erythema.  Lab Results:  Lab Results  Component Value Date   WBC 10.0 08/12/2021   HGB 12.2 08/12/2021   HCT 37.6 08/12/2021   MCV 95.4 08/12/2021   PLT 266 08/12/2021   NEUTROABS 7.7 08/12/2021    Imaging:  No results found.  Medications: I have reviewed the patient's current medications.  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 CT abdomen/pelvis 12/21/2020-large right liver mass with peripheral intrahepatic biliary duct dilatation in the right liver, expansile tumor thrombus occluding the main, left, and right portal veins, mild porta hepatis lymphadenopathy, indeterminate 2 cm hypodense right renal lesion, small volume ascites MRI abdomen 12/30/2020-9.4 x 6.8 cm liver mass centered in segment 5 with involvement of segments 4A and 8, peripheral intrahepatic biliary ductal dilatation, expansile occlusive tumor thrombus in the main, left, right portal veins, mass characterized as LR-M, solitary 0.9 cm segment 2 lesion suspicious for metastasis, mildly enlarged portacaval node, small  volume ascites, right renal cortical mass-renal cell carcinoma not excluded Ultrasound-guided biopsy of right liver mass on 01/09/2021-pathology refer to [facility]- carcinoma with predominantly glandular and small foci of hepatocellular differentiation cholangiocarcinoma favored, NGS-IDH1 alteration, MSS, tumor mutation burden 3 Cycle 1 gemcitabine/cisplatin 04/13/2021, day 8 04/19/2021 Cycle 2 gemcitabine/cisplatin 05/02/2021, treatment changed to an every 2-week schedule Gemcitabine alone 05/02/2021 Chemotherapy discontinued per patient preference CT abdomen/pelvis 07/22/2021-moderate ascites, enlargement of ill marginated liver mass occupying the right liver and extending to the caudate with tumor extension into the hepatic veins and right portal vein Therapeutic paracentesis 08/02/2021, 08/10/2021, 09/05/2021   Addison's disease Anorexia/weight loss secondary to #1 Family history of ovarian cancer Hypothyroidism Intermittent diarrhea-related to the right liver mass? Report of intermittent small-volume rectal bleeding Severe fatigue/malaise, question related to cisplatin/chemotherapy versus cancer-improved      Disposition: [name] has cholangiocarcinoma.  She is enrolled in home hospice care.  Performance status is slowly declining.  We will make arrangements for another paracentesis.  She feels significantly better after the fluid is removed.  She will return for follow-up and Port-A-Cath flush in 5 to 6 weeks.  We are available to see her sooner if needed.    Ned Card ANP/GNP-BC   10/17/2021  2:10 PM

## 2021-10-18 ENCOUNTER — Inpatient Hospital Stay: Payer: Medicare Other

## 2021-10-18 ENCOUNTER — Ambulatory Visit (HOSPITAL_COMMUNITY)
Admission: RE | Admit: 2021-10-18 | Discharge: 2021-10-18 | Disposition: A | Discharge status: Home / Self Care | Source: Ambulatory Visit | Attending: Nurse Practitioner | Admitting: Nurse Practitioner

## 2021-10-18 ENCOUNTER — Inpatient Hospital Stay: Payer: Medicare Other | Admitting: Oncology

## 2021-10-18 DIAGNOSIS — C221 Intrahepatic bile duct carcinoma: Secondary | ICD-10-CM | POA: Diagnosis present

## 2021-10-18 MED ORDER — LIDOCAINE HCL 1 % IJ SOLN
INTRAMUSCULAR | Status: AC
  Administered 2021-10-18: 10 mL
  Filled 2021-10-18: qty 20

## 2021-10-18 NOTE — Procedures (Signed)
PROCEDURE SUMMARY:  Successful image-guided paracentesis from the right lower abdomen.  Yielded 2 liters of clear yellow fluid.  No immediate complications.  EBL = trace. Patient tolerated well.   Specimen was not sent for labs.  Please see imaging section of Epic for full dictation.   Armando Gang Robey Massmann PA-C 10/18/2021 10:44 AM

## 2021-10-26 ENCOUNTER — Telehealth: Payer: Self-pay

## 2021-10-26 NOTE — Telephone Encounter (Signed)
TC form [facility] Nurse [phone] stating they are re certifying for another 13 weeks.

## 2021-10-29 ENCOUNTER — Other Ambulatory Visit: Payer: Self-pay

## 2021-10-29 ENCOUNTER — Ambulatory Visit (HOSPITAL_COMMUNITY)
Admission: RE | Admit: 2021-10-29 | Discharge: 2021-10-29 | Disposition: A | Discharge status: Home / Self Care | Source: Ambulatory Visit | Attending: Oncology | Admitting: Oncology

## 2021-10-29 DIAGNOSIS — C221 Intrahepatic bile duct carcinoma: Secondary | ICD-10-CM | POA: Diagnosis not present

## 2021-10-29 DIAGNOSIS — R188 Other ascites: Secondary | ICD-10-CM | POA: Diagnosis not present

## 2021-10-29 HISTORY — PX: IR PARACENTESIS: IMG2679

## 2021-10-29 MED ORDER — LIDOCAINE HCL 1 % IJ SOLN
INTRAMUSCULAR | Status: AC
  Filled 2021-10-29: qty 20

## 2021-10-29 MED ORDER — LIDOCAINE HCL (PF) 1 % IJ SOLN
INTRAMUSCULAR | Status: DC | PRN
  Administered 2021-10-29: 5 mL

## 2021-10-29 NOTE — Procedures (Signed)
PROCEDURE SUMMARY:  Successful US guided therapeutic paracentesis from RLQ  Yielded 2.9 L of clear, yellow fluid.  No immediate complications.  Pt tolerated well.   Specimen not sent for labs.  EBL < 1 mL  Tyson Alias, AGNP 10/29/2021 2:25 PM

## 2021-10-30 ENCOUNTER — Ambulatory Visit (HOSPITAL_COMMUNITY): Payer: Medicare Other

## 2021-10-30 ENCOUNTER — Other Ambulatory Visit: Payer: Self-pay

## 2021-10-30 ENCOUNTER — Encounter: Payer: Self-pay | Admitting: Oncology

## 2021-10-30 ENCOUNTER — Telehealth: Payer: Self-pay

## 2021-10-30 DIAGNOSIS — C221 Intrahepatic bile duct carcinoma: Secondary | ICD-10-CM

## 2021-10-30 NOTE — Telephone Encounter (Signed)
Called and spoke with the patient daughter to informed her the patient is schedule for her paracentesis on 11/07/21 at 2. Patient needs to be at [facility] at 130. Patient daughter voiced understanding had no further questions or concern at this time

## 2021-11-07 ENCOUNTER — Other Ambulatory Visit: Payer: Self-pay

## 2021-11-07 ENCOUNTER — Ambulatory Visit (HOSPITAL_COMMUNITY)
Admission: RE | Admit: 2021-11-07 | Discharge: 2021-11-07 | Disposition: A | Discharge status: Home / Self Care | Source: Ambulatory Visit | Attending: Nurse Practitioner | Admitting: Nurse Practitioner

## 2021-11-07 DIAGNOSIS — C221 Intrahepatic bile duct carcinoma: Secondary | ICD-10-CM | POA: Insufficient documentation

## 2021-11-07 DIAGNOSIS — R188 Other ascites: Secondary | ICD-10-CM | POA: Diagnosis not present

## 2021-11-07 HISTORY — PX: IR PARACENTESIS: IMG2679

## 2021-11-07 MED ORDER — LIDOCAINE HCL 1 % IJ SOLN
INTRAMUSCULAR | Status: AC
  Administered 2021-11-07: 14:00:00 10 mL
  Filled 2021-11-07: qty 20

## 2021-11-07 NOTE — Procedures (Signed)
PROCEDURE SUMMARY:  Successful US guided therapeutic paracentesis from RLQ.   Yielded 2.4 L of clear, yellow fluid.  No immediate complications.  Pt tolerated well.   Specimen not sent for labs.  EBL < 1 mL  Tyson Alias, AGNP 11/07/2021 1:57 PM

## 2021-11-13 ENCOUNTER — Encounter: Payer: Self-pay | Admitting: Oncology

## 2021-11-14 ENCOUNTER — Other Ambulatory Visit: Payer: Self-pay

## 2021-11-14 DIAGNOSIS — C221 Intrahepatic bile duct carcinoma: Secondary | ICD-10-CM

## 2021-11-20 ENCOUNTER — Other Ambulatory Visit: Payer: Self-pay

## 2021-11-20 ENCOUNTER — Ambulatory Visit (HOSPITAL_COMMUNITY)
Admission: RE | Admit: 2021-11-20 | Discharge: 2021-11-20 | Disposition: A | Discharge status: Home / Self Care | Source: Ambulatory Visit | Attending: Nurse Practitioner | Admitting: Nurse Practitioner

## 2021-11-20 DIAGNOSIS — R188 Other ascites: Secondary | ICD-10-CM | POA: Diagnosis not present

## 2021-11-20 DIAGNOSIS — C221 Intrahepatic bile duct carcinoma: Secondary | ICD-10-CM

## 2021-11-20 HISTORY — PX: IR PARACENTESIS: IMG2679

## 2021-11-20 MED ORDER — LIDOCAINE HCL 1 % IJ SOLN
INTRAMUSCULAR | Status: DC | PRN
  Administered 2021-11-20: 5 mL via INTRADERMAL

## 2021-11-20 MED ORDER — LIDOCAINE HCL 1 % IJ SOLN
INTRAMUSCULAR | Status: AC
  Filled 2021-11-20: qty 20

## 2021-11-20 NOTE — Procedures (Signed)
PROCEDURE SUMMARY:  Successful US guided paracentesis from RLQ.  Yielded 2L of yellow fluid.  No immediate complications.  Pt tolerated well.   Specimen not sent for labs.  EBL < 36mL  Santia Labate PA-C 11/20/2021 1:50 PM

## 2021-11-21 ENCOUNTER — Other Ambulatory Visit (HOSPITAL_COMMUNITY): Payer: Medicare Other

## 2021-11-22 ENCOUNTER — Inpatient Hospital Stay: Payer: Medicare Other

## 2021-11-22 ENCOUNTER — Other Ambulatory Visit: Payer: Self-pay

## 2021-11-22 ENCOUNTER — Inpatient Hospital Stay: Payer: Medicare Other | Attending: Nurse Practitioner | Admitting: Oncology

## 2021-11-22 ENCOUNTER — Telehealth: Payer: Self-pay

## 2021-11-22 VITALS — BP 118/56 | HR 72 | Temp 97.8°F | Resp 20 | Ht 66.0 in | Wt 128.4 lb

## 2021-11-22 DIAGNOSIS — R634 Abnormal weight loss: Secondary | ICD-10-CM | POA: Insufficient documentation

## 2021-11-22 DIAGNOSIS — R197 Diarrhea, unspecified: Secondary | ICD-10-CM | POA: Insufficient documentation

## 2021-11-22 DIAGNOSIS — E039 Hypothyroidism, unspecified: Secondary | ICD-10-CM | POA: Insufficient documentation

## 2021-11-22 DIAGNOSIS — C221 Intrahepatic bile duct carcinoma: Secondary | ICD-10-CM | POA: Insufficient documentation

## 2021-11-22 DIAGNOSIS — R5381 Other malaise: Secondary | ICD-10-CM | POA: Diagnosis not present

## 2021-11-22 DIAGNOSIS — K625 Hemorrhage of anus and rectum: Secondary | ICD-10-CM | POA: Insufficient documentation

## 2021-11-22 DIAGNOSIS — R188 Other ascites: Secondary | ICD-10-CM | POA: Diagnosis not present

## 2021-11-22 DIAGNOSIS — E271 Primary adrenocortical insufficiency: Secondary | ICD-10-CM | POA: Insufficient documentation

## 2021-11-22 DIAGNOSIS — Z452 Encounter for adjustment and management of vascular access device: Secondary | ICD-10-CM | POA: Insufficient documentation

## 2021-11-22 DIAGNOSIS — Z95828 Presence of other vascular implants and grafts: Secondary | ICD-10-CM

## 2021-11-22 DIAGNOSIS — Z8041 Family history of malignant neoplasm of ovary: Secondary | ICD-10-CM | POA: Insufficient documentation

## 2021-11-22 DIAGNOSIS — Z9221 Personal history of antineoplastic chemotherapy: Secondary | ICD-10-CM | POA: Insufficient documentation

## 2021-11-22 DIAGNOSIS — R5383 Other fatigue: Secondary | ICD-10-CM

## 2021-11-22 MED ORDER — SODIUM CHLORIDE 0.9% FLUSH
10.0000 mL | Freq: Once | INTRAVENOUS | Status: AC
  Administered 2021-11-22: 10 mL via INTRAVENOUS

## 2021-11-22 MED ORDER — HEPARIN SOD (PORK) LOCK FLUSH 100 UNIT/ML IV SOLN
500.0000 [IU] | Freq: Once | INTRAVENOUS | Status: AC
  Administered 2021-11-22: 500 [IU] via INTRAVENOUS

## 2021-11-22 NOTE — Telephone Encounter (Signed)
TC to [name] with [facility] [phone] Hospice Nurse  for Pt to inform her that Dr Benay Spice is requesting Pt's port be flushed every 6 weeks. Per [name] Pt will get her port flushed in 6 weeks.

## 2021-11-22 NOTE — Patient Instructions (Signed)

## 2021-11-22 NOTE — Progress Notes (Signed)
[place] OFFICE PROGRESS NOTE   Diagnosis: Cholangiocarcinoma  INTERVAL HISTORY:   Ms. Jaggers returns as scheduled.  She is here with her daughter.  She remains in hospice care.  The hospice nurses visiting weekly.  No abdominal pain.  Good appetite.  She continues intermittent paracentesis procedures.  She last had a paracentesis on 11/20/2021 for 2 L of fluid.  She generally feels better after paracentesis procedure, but the most recent paracentesis did not help.  There is persistent ascites.  Objective:  Vital signs in last 24 hours:  Blood pressure (!) 118/56, pulse 72, temperature 97.8 F (36.6 C), temperature source Oral, resp. rate 20, height _0  (1.676 m), weight 128 lb 6.4 oz (58.2 kg), SpO2 98 %.     Resp: Lungs clear bilaterally Cardio: Regular rate and rhythm GI: The liver edge is palpable in the right upper abdomen, nontender, the abdomen is mildly distended with ascites Vascular: No leg edema   Portacath/PICC-without erythema  Lab Results:  Lab Results  Component Value Date   WBC 10.0 08/12/2021   HGB 12.2 08/12/2021   HCT 37.6 08/12/2021   MCV 95.4 08/12/2021   PLT 266 08/12/2021   NEUTROABS 7.7 08/12/2021    CMP  Lab Results  Component Value Date   NA 135 08/12/2021   K 3.3 (L) 08/12/2021   CL 99 08/12/2021   CO2 27 08/12/2021   GLUCOSE 109 (H) 08/12/2021   BUN 16 08/12/2021   CREATININE 0.65 08/12/2021   CALCIUM 9.2 08/12/2021   PROT 6.3 (L) 08/12/2021   ALBUMIN 2.9 (L) 08/12/2021   AST 58 (H) 08/12/2021   ALT 23 08/12/2021   ALKPHOS 284 (H) 08/12/2021   BILITOT 0.9 08/12/2021   GFRNONAA >60 08/12/2021   GFRAA 64 (L) 11/03/2012    Imaging:  IR Paracentesis  Addendum Date: 11/20/2021   ADDENDUM REPORT: 11/20/2021 14:47 ADDENDUM: Findings should state: A total of approximately 2.0 L of serous ascitic fluid was removed. Impression should state: Successful ultrasound-guided therapeutic paracentesis yielding 2.0 L of  ascitic fluid. Procedure performed by Pasty Spillers, PA-C Electronically Signed   By: Michaelle Birks M.D.   On: 11/20/2021 14:47   Result Date: 11/20/2021 INDICATION: HEPATIC MASS WITH REFRACTORY ASCITES. EXAM: ULTRASOUND-GUIDED THERAPEUTIC PARACENTESIS COMPARISON:  IR ultrasound, most recently 11/07/2021. CT AP, 07/22/2021 MEDICATIONS: 10 mL 1% lidocaine. COMPLICATIONS: None immediate. TECHNIQUE: Informed written consent was obtained from the the patient and/or patient's representative after a discussion of the risks, benefits and alternatives to treatment. A timeout was performed prior to the initiation of the procedure. Initial ultrasound scanning demonstrates a large amount of ascites within the RIGHT lower abdomen which was subsequently prepped and draped in the usual sterile fashion. 1% lidocaine with epinephrine was used for local anesthesia. An ultrasound image was saved for documentation purposed. An 8 Fr Safe-T-Centesis catheter was introduced. The paracentesis was performed. The catheter was removed and a dressing was applied. The patient tolerated the procedure well without immediate post procedural complication. FINDINGS: A total of approximately 2.4 L of serous ascitic fluid was removed. IMPRESSION: Successful ultrasound-guided therapeutic paracentesis yielding 2.4 L of ascitic fluid. Procedure performed by: Narda Rutherford, IR NP Electronically Signed: By: Michaelle Birks M.D. On: 11/20/2021 13:19    Medications: I have reviewed the patient's current medications.   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-intrahepatic cholangiocarcinoma CT abdomen/pelvis 12/21/2020-large right liver mass with peripheral intrahepatic biliary duct dilatation in the right liver, expansile tumor thrombus occluding the  main, left, and right portal veins, mild porta hepatis lymphadenopathy, indeterminate 2 cm hypodense right renal lesion, small volume ascites MRI abdomen 12/30/2020-9.4 x 6.8 cm liver  mass centered in segment 5 with involvement of segments 4A and 8, peripheral intrahepatic biliary ductal dilatation, expansile occlusive tumor thrombus in the main, left, right portal veins, mass characterized as LR-M, solitary 0.9 cm segment 2 lesion suspicious for metastasis, mildly enlarged portacaval node, small volume ascites, right renal cortical mass-renal cell carcinoma not excluded Ultrasound-guided biopsy of right liver mass on 01/09/2021-pathology refer to [facility]- carcinoma with predominantly glandular and small foci of hepatocellular differentiation cholangiocarcinoma favored, NGS-IDH1 alteration, MSS, tumor mutation burden 3 Cycle 1 gemcitabine/cisplatin 04/13/2021, day 8 04/19/2021 Cycle 2 gemcitabine/cisplatin 05/02/2021, treatment changed to an every 2-week schedule Gemcitabine alone 05/02/2021 Chemotherapy discontinued per patient preference CT abdomen/pelvis 07/22/2021-moderate ascites, enlargement of ill marginated liver mass occupying the right liver and extending to the caudate with tumor extension into the hepatic veins and right portal vein Therapeutic paracentesis, last 11/20/2021   Addison's disease Anorexia/weight loss secondary to #1 Family history of ovarian cancer Hypothyroidism Intermittent diarrhea-related to the right liver mass? Report of intermittent small-volume rectal bleeding Severe fatigue/malaise, question related to cisplatin/chemotherapy versus cancer-improved      Disposition: Ms. Illingworth has cholangiocarcinoma.  She is enrolled in hospice care.  She appears stable.  She will continue palliative paracentesis procedures as needed.  She would like to continue follow-up at the cancer center.  She will return for an office visit in 8 weeks.  We will ask the hospice team to begin flushing her Port-A-Cath.  The Port-A-Cath was flushed today.  Betsy Coder, MD  11/22/2021  12:45 PM

## 2021-11-30 ENCOUNTER — Other Ambulatory Visit: Payer: Self-pay

## 2021-11-30 ENCOUNTER — Ambulatory Visit (HOSPITAL_COMMUNITY)
Admission: RE | Admit: 2021-11-30 | Discharge: 2021-11-30 | Disposition: A | Discharge status: Home / Self Care | Source: Ambulatory Visit | Attending: Nurse Practitioner | Admitting: Nurse Practitioner

## 2021-11-30 DIAGNOSIS — R188 Other ascites: Secondary | ICD-10-CM | POA: Insufficient documentation

## 2021-11-30 DIAGNOSIS — C221 Intrahepatic bile duct carcinoma: Secondary | ICD-10-CM

## 2021-11-30 HISTORY — PX: IR PARACENTESIS: IMG2679

## 2021-11-30 MED ORDER — LIDOCAINE HCL 1 % IJ SOLN
INTRAMUSCULAR | Status: AC
  Administered 2021-11-30: 10 mL
  Filled 2021-11-30: qty 20

## 2021-11-30 NOTE — Procedures (Signed)
PROCEDURE SUMMARY:  Successful US guided therapeutic paracentesis from RLQ.  Yielded 3 L of clear, yellow fluid.  No immediate complications.  Pt tolerated well.   Specimen not sent for labs.  EBL < 1 mL  Tyson Alias, AGNP 11/30/2021 12:42 PM

## 2021-12-05 ENCOUNTER — Encounter: Payer: Self-pay | Admitting: Oncology

## 2021-12-05 ENCOUNTER — Encounter: Payer: Self-pay | Admitting: Nurse Practitioner

## 2021-12-05 ENCOUNTER — Other Ambulatory Visit (HOSPITAL_COMMUNITY): Payer: Medicare Other

## 2021-12-05 ENCOUNTER — Inpatient Hospital Stay: Payer: Medicare Other

## 2021-12-05 ENCOUNTER — Other Ambulatory Visit: Payer: Self-pay

## 2021-12-05 ENCOUNTER — Inpatient Hospital Stay (HOSPITAL_BASED_OUTPATIENT_CLINIC_OR_DEPARTMENT_OTHER): Payer: Medicare Other | Admitting: Nurse Practitioner

## 2021-12-05 VITALS — BP 122/66 | HR 75 | Temp 98.7°F | Resp 18 | Ht 66.0 in | Wt 124.0 lb

## 2021-12-05 DIAGNOSIS — Z95828 Presence of other vascular implants and grafts: Secondary | ICD-10-CM

## 2021-12-05 DIAGNOSIS — C221 Intrahepatic bile duct carcinoma: Secondary | ICD-10-CM

## 2021-12-05 DIAGNOSIS — Z452 Encounter for adjustment and management of vascular access device: Secondary | ICD-10-CM | POA: Diagnosis not present

## 2021-12-05 LAB — CMP (CANCER CENTER ONLY)
ALT: 29 U/L (ref 0–44)
AST: 67 U/L — ABNORMAL HIGH (ref 15–41)
Albumin: 3.3 g/dL — ABNORMAL LOW (ref 3.5–5.0)
Alkaline Phosphatase: 404 U/L — ABNORMAL HIGH (ref 38–126)
Anion gap: 10 (ref 5–15)
BUN: 21 mg/dL (ref 8–23)
CO2: 27 mmol/L (ref 22–32)
Calcium: 9.7 mg/dL (ref 8.9–10.3)
Chloride: 98 mmol/L (ref 98–111)
Creatinine: 0.58 mg/dL (ref 0.44–1.00)
GFR, Estimated: >60 mL/min (ref >60)
Glucose, Bld: 121 mg/dL — ABNORMAL HIGH (ref 70–99)
Potassium: 3.6 mmol/L (ref 3.5–5.1)
Sodium: 135 mmol/L (ref 135–145)
Total Bilirubin: 1 mg/dL (ref 0.3–1.2)
Total Protein: 6.4 g/dL — ABNORMAL LOW (ref 6.5–8.1)

## 2021-12-05 LAB — CBC WITH DIFFERENTIAL (CANCER CENTER ONLY)
Abs Immature Granulocytes: 0.08 10*3/uL — ABNORMAL HIGH (ref 0.00–0.07)
Basophils Absolute: 0 10*3/uL (ref 0.0–0.1)
Basophils Relative: 0 %
Eosinophils Absolute: 0 10*3/uL (ref 0.0–0.5)
Eosinophils Relative: 0 %
HCT: 36.2 % (ref 36.0–46.0)
Hemoglobin: 11.4 g/dL — ABNORMAL LOW (ref 12.0–15.0)
Immature Granulocytes: 1 %
Lymphocytes Relative: 8 %
Lymphs Abs: 1.1 10*3/uL (ref 0.7–4.0)
MCH: 28.9 pg (ref 26.0–34.0)
MCHC: 31.5 g/dL (ref 30.0–36.0)
MCV: 91.6 fL (ref 80.0–100.0)
Monocytes Absolute: 1.3 10*3/uL — ABNORMAL HIGH (ref 0.1–1.0)
Monocytes Relative: 10 %
Neutro Abs: 10.7 10*3/uL — ABNORMAL HIGH (ref 1.7–7.7)
Neutrophils Relative %: 81 %
Platelet Count: 290 10*3/uL (ref 150–400)
RBC: 3.95 MIL/uL (ref 3.87–5.11)
RDW: 15.1 % (ref 11.5–15.5)
WBC Count: 13.2 10*3/uL — ABNORMAL HIGH (ref 4.0–10.5)
nRBC: 0 % (ref 0.0–0.2)

## 2021-12-05 LAB — URINALYSIS, COMPLETE (UACMP) WITH MICROSCOPIC
Glucose, UA: NEGATIVE mg/dL
Ketones, ur: NEGATIVE mg/dL
Nitrite: NEGATIVE
Specific Gravity, Urine: >1.03 — ABNORMAL HIGH (ref 1.005–1.030)
pH: 5.5 (ref 5.0–8.0)

## 2021-12-05 MED ORDER — HEPARIN SOD (PORK) LOCK FLUSH 100 UNIT/ML IV SOLN
500.0000 [IU] | Freq: Once | INTRAVENOUS | Status: AC
  Administered 2021-12-05: 500 [IU] via INTRAVENOUS

## 2021-12-05 MED ORDER — SODIUM CHLORIDE 0.9% FLUSH
10.0000 mL | Freq: Once | INTRAVENOUS | Status: AC
  Administered 2021-12-05: 10 mL via INTRAVENOUS

## 2021-12-05 NOTE — Progress Notes (Signed)
[place] OFFICE PROGRESS NOTE   Diagnosis: Cholangiocarcinoma  INTERVAL HISTORY:   [name] returns prior to scheduled follow-up.  She had a paracentesis on 11/30/2021 with 3 L of fluid removed.  Her daughter contacted the office earlier today requesting an appointment for evaluation of mild nausea and weakness.  Appetite is poor.  She feels fluid intake is suboptimal.  No significant pain.  No vomiting or diarrhea.  Zofran earlier today helped the nausea.  Objective:  Vital signs in last 24 hours:  Blood pressure 122/66, pulse 75, temperature 98.7 F (37.1 C), temperature source Oral, resp. rate 18, height $RemoveBe'5\' 6"'YTKtyFjFs$  (1.676 m), weight 124 lb (56.2 kg), SpO2 98 %.    HEENT: No thrush or ulcers.  Sclera anicteric. Resp: Lungs clear bilaterally. Cardio: Regular rate and rhythm. GI: Abdomen is soft, distended. Vascular: No leg edema. Neuro: Alert and oriented. Skin: Mild decrease in skin turgor.   Lab Results:  Lab Results  Component Value Date   WBC 10.0 08/12/2021   HGB 12.2 08/12/2021   HCT 37.6 08/12/2021   MCV 95.4 08/12/2021   PLT 266 08/12/2021   NEUTROABS 7.7 08/12/2021    Imaging:  No results found.  Medications: I have reviewed the patient's current medications.  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 CT abdomen/pelvis 12/21/2020-large right liver mass with peripheral intrahepatic biliary duct dilatation in the right liver, expansile tumor thrombus occluding the main, left, and right portal veins, mild porta hepatis lymphadenopathy, indeterminate 2 cm hypodense right renal lesion, small volume ascites MRI abdomen 12/30/2020-9.4 x 6.8 cm liver mass centered in segment 5 with involvement of segments 4A and 8, peripheral intrahepatic biliary ductal dilatation, expansile occlusive tumor thrombus in the main, left, right portal veins, mass characterized as LR-M, solitary 0.9 cm segment 2 lesion  suspicious for metastasis, mildly enlarged portacaval node, small volume ascites, right renal cortical mass-renal cell carcinoma not excluded Ultrasound-guided biopsy of right liver mass on 01/09/2021-pathology refer to [facility]- carcinoma with predominantly glandular and small foci of hepatocellular differentiation cholangiocarcinoma favored, NGS-IDH1 alteration, MSS, tumor mutation burden 3 Cycle 1 gemcitabine/cisplatin 04/13/2021, day 8 04/19/2021 Cycle 2 gemcitabine/cisplatin 05/02/2021, treatment changed to an every 2-week schedule Gemcitabine alone 05/02/2021 Chemotherapy discontinued per patient preference CT abdomen/pelvis 07/22/2021-moderate ascites, enlargement of ill marginated liver mass occupying the right liver and extending to the caudate with tumor extension into the hepatic veins and right portal vein Therapeutic paracentesis, last 11/20/2021   Addison's disease Anorexia/weight loss secondary to #1 Family history of ovarian cancer Hypothyroidism Intermittent diarrhea-related to the right liver mass? Report of intermittent small-volume rectal bleeding Severe fatigue/malaise, question related to cisplatin/chemotherapy versus cancer-improved    Disposition: [name] has cholangiocarcinoma.  She is enrolled in the hospice program.  She has periodic palliative paracentesis procedures, most recent 11/30/2021.  She presents today with weakness and nausea.  She and her daughter understand symptoms may be related to progression of the cancer.  We decided to check basic labs including a CBC and chemistry panel.  She will submit a urine specimen.  She will make sure she is staying hydrated.  She will continue Zofran for the nausea.  Hospice nurse is visiting tomorrow.  She is scheduled to return for follow-up here 01/22/2022.  She will contact the office in the interim with further problems.    Ned Card ANP/GNP-BC   12/05/2021  2:04 PM

## 2021-12-07 ENCOUNTER — Telehealth: Payer: Self-pay

## 2021-12-07 ENCOUNTER — Encounter: Payer: Self-pay | Admitting: Nurse Practitioner

## 2021-12-07 LAB — URINE CULTURE: Culture: 40000 — AB

## 2021-12-07 NOTE — Telephone Encounter (Signed)
I message the daughter via my chart to let her know labs are unremarkable/ unchanged and do not give an explanation for her symptoms

## 2021-12-07 NOTE — Telephone Encounter (Signed)
-----   Message from Owens Shark, NP sent at 12/07/2021  8:27 AM EST ----- Please call and check on her.  I think her daughter wants to be the one called.  Let them know labs are unremarkable/unchanged and do not give an explanation for her symptoms.

## 2021-12-10 ENCOUNTER — Inpatient Hospital Stay (HOSPITAL_COMMUNITY): Admission: RE | Admit: 2021-12-10 | Payer: Medicare Other | Source: Ambulatory Visit

## 2021-12-11 ENCOUNTER — Other Ambulatory Visit (HOSPITAL_COMMUNITY): Payer: Medicare Other

## 2021-12-13 ENCOUNTER — Ambulatory Visit (HOSPITAL_COMMUNITY): Payer: Medicare Other

## 2021-12-14 ENCOUNTER — Other Ambulatory Visit (HOSPITAL_COMMUNITY): Payer: Self-pay | Admitting: Nurse Practitioner

## 2021-12-14 ENCOUNTER — Ambulatory Visit (HOSPITAL_COMMUNITY)
Admission: RE | Admit: 2021-12-14 | Discharge: 2021-12-14 | Disposition: A | Discharge status: Home / Self Care | Source: Ambulatory Visit | Attending: Nurse Practitioner | Admitting: Nurse Practitioner

## 2021-12-14 ENCOUNTER — Other Ambulatory Visit: Payer: Self-pay

## 2021-12-14 DIAGNOSIS — R188 Other ascites: Secondary | ICD-10-CM

## 2021-12-14 HISTORY — PX: IR PARACENTESIS: IMG2679

## 2021-12-14 MED ORDER — LIDOCAINE HCL 1 % IJ SOLN
INTRAMUSCULAR | Status: AC
  Filled 2021-12-14: qty 20

## 2021-12-14 MED ORDER — LIDOCAINE HCL (PF) 1 % IJ SOLN
INTRAMUSCULAR | Status: DC | PRN
  Administered 2021-12-14: 5 mL

## 2021-12-14 NOTE — Procedures (Signed)
PROCEDURE SUMMARY: ? ?Successful image-guided paracentesis from the right lower abdomen.  ?Yielded 2.2 liters of clear yellow fluid.  ?No immediate complications.  ?EBL = trace. ?Patient tolerated well.  ? ?Specimen was not sent for labs. ? ?Please see imaging section of Epic for full dictation. ? ? ?Annalynne Ibanez H Kailen Name PA-C ?12/14/2021 ?10:25 AM ? ? ? ?

## 2021-12-19 ENCOUNTER — Other Ambulatory Visit (HOSPITAL_COMMUNITY): Payer: Medicare Other

## 2021-12-24 ENCOUNTER — Other Ambulatory Visit: Payer: Self-pay

## 2021-12-24 ENCOUNTER — Ambulatory Visit (HOSPITAL_COMMUNITY)
Admission: RE | Admit: 2021-12-24 | Discharge: 2021-12-24 | Disposition: A | Discharge status: Home / Self Care | Source: Ambulatory Visit | Attending: Nurse Practitioner | Admitting: Nurse Practitioner

## 2021-12-24 DIAGNOSIS — C221 Intrahepatic bile duct carcinoma: Secondary | ICD-10-CM | POA: Diagnosis not present

## 2021-12-24 DIAGNOSIS — R188 Other ascites: Secondary | ICD-10-CM | POA: Diagnosis not present

## 2021-12-24 HISTORY — PX: IR PARACENTESIS: IMG2679

## 2021-12-24 MED ORDER — LIDOCAINE HCL 1 % IJ SOLN
INTRAMUSCULAR | Status: AC
  Administered 2021-12-24: 10 mL
  Filled 2021-12-24: qty 20

## 2021-12-24 NOTE — Procedures (Signed)
PROCEDURE SUMMARY: ? ?Successful US guided paracentesis from right lateral abdomen.  ?Yielded 1.6 liters of yellow fluid.  ?No immediate complications.  ?Pt tolerated well.  ? ?Specimen was not sent for labs. ? ?EBL < 44m ? ?KDocia BarrierPA-C ?12/24/2021 ?11:17 AM ? ? ? ?

## 2021-12-25 ENCOUNTER — Other Ambulatory Visit: Payer: Self-pay

## 2021-12-25 ENCOUNTER — Telehealth: Payer: Self-pay

## 2021-12-25 ENCOUNTER — Encounter: Payer: Self-pay | Admitting: Oncology

## 2021-12-25 DIAGNOSIS — C221 Intrahepatic bile duct carcinoma: Secondary | ICD-10-CM

## 2021-12-25 NOTE — Telephone Encounter (Signed)
Chart Review

## 2022-01-01 ENCOUNTER — Encounter: Payer: Self-pay | Admitting: Oncology

## 2022-01-03 ENCOUNTER — Other Ambulatory Visit: Payer: Self-pay

## 2022-01-03 ENCOUNTER — Ambulatory Visit (HOSPITAL_COMMUNITY)
Admission: RE | Admit: 2022-01-03 | Discharge: 2022-01-03 | Disposition: A | Discharge status: Home / Self Care | Source: Ambulatory Visit | Attending: Nurse Practitioner | Admitting: Nurse Practitioner

## 2022-01-03 ENCOUNTER — Other Ambulatory Visit: Payer: Self-pay | Admitting: Nurse Practitioner

## 2022-01-03 DIAGNOSIS — R188 Other ascites: Secondary | ICD-10-CM | POA: Insufficient documentation

## 2022-01-03 DIAGNOSIS — C221 Intrahepatic bile duct carcinoma: Secondary | ICD-10-CM

## 2022-01-03 MED ORDER — LIDOCAINE HCL 1 % IJ SOLN
INTRAMUSCULAR | Status: AC
  Filled 2022-01-03: qty 20

## 2022-01-03 NOTE — Procedures (Signed)
Pt was brought to [place] for paracentesis. Upon US examination, there was no fluid on L and a very small fluid pocket on R that was not large enough to safely access.  ?Exam was ended and explained to patient.  ?Patient was in agreement with findings.    ? ? ?Narda Rutherford, AGNP-BC ?01/03/2022, 2:29 PM ? ? ?

## 2022-01-04 ENCOUNTER — Encounter: Payer: Self-pay | Admitting: Oncology

## 2022-01-04 DIAGNOSIS — C221 Intrahepatic bile duct carcinoma: Secondary | ICD-10-CM

## 2022-01-07 ENCOUNTER — Other Ambulatory Visit: Payer: Self-pay

## 2022-01-07 ENCOUNTER — Inpatient Hospital Stay: Attending: Nurse Practitioner | Admitting: Nurse Practitioner

## 2022-01-07 ENCOUNTER — Ambulatory Visit (HOSPITAL_COMMUNITY)
Admission: RE | Admit: 2022-01-07 | Discharge: 2022-01-07 | Disposition: A | Discharge status: Home / Self Care | Source: Ambulatory Visit | Attending: Oncology | Admitting: Oncology

## 2022-01-07 ENCOUNTER — Inpatient Hospital Stay

## 2022-01-07 ENCOUNTER — Telehealth: Payer: Self-pay

## 2022-01-07 ENCOUNTER — Encounter: Payer: Self-pay | Admitting: Nurse Practitioner

## 2022-01-07 VITALS — BP 113/53 | HR 84 | Temp 97.8°F | Resp 18 | Ht 66.0 in | Wt 125.6 lb

## 2022-01-07 DIAGNOSIS — E271 Primary adrenocortical insufficiency: Secondary | ICD-10-CM | POA: Insufficient documentation

## 2022-01-07 DIAGNOSIS — Z8041 Family history of malignant neoplasm of ovary: Secondary | ICD-10-CM | POA: Diagnosis not present

## 2022-01-07 DIAGNOSIS — K625 Hemorrhage of anus and rectum: Secondary | ICD-10-CM | POA: Diagnosis not present

## 2022-01-07 DIAGNOSIS — R634 Abnormal weight loss: Secondary | ICD-10-CM | POA: Diagnosis not present

## 2022-01-07 DIAGNOSIS — R188 Other ascites: Secondary | ICD-10-CM | POA: Diagnosis not present

## 2022-01-07 DIAGNOSIS — C221 Intrahepatic bile duct carcinoma: Secondary | ICD-10-CM

## 2022-01-07 DIAGNOSIS — E039 Hypothyroidism, unspecified: Secondary | ICD-10-CM | POA: Insufficient documentation

## 2022-01-07 DIAGNOSIS — Z9221 Personal history of antineoplastic chemotherapy: Secondary | ICD-10-CM | POA: Diagnosis not present

## 2022-01-07 DIAGNOSIS — R197 Diarrhea, unspecified: Secondary | ICD-10-CM | POA: Insufficient documentation

## 2022-01-07 HISTORY — PX: IR PARACENTESIS: IMG2679

## 2022-01-07 LAB — URINALYSIS, COMPLETE (UACMP) WITH MICROSCOPIC
Glucose, UA: NEGATIVE mg/dL
Ketones, ur: NEGATIVE mg/dL
Leukocytes,Ua: NEGATIVE
Nitrite: NEGATIVE
Protein, ur: NEGATIVE mg/dL
Specific Gravity, Urine: 1.025 (ref 1.005–1.030)
pH: 5.5 (ref 5.0–8.0)

## 2022-01-07 MED ORDER — LIDOCAINE HCL (PF) 1 % IJ SOLN
INTRAMUSCULAR | Status: DC | PRN
  Administered 2022-01-07: 5 mL

## 2022-01-07 MED ORDER — LIDOCAINE HCL 1 % IJ SOLN
INTRAMUSCULAR | Status: AC
  Filled 2022-01-07: qty 20

## 2022-01-07 NOTE — Procedures (Signed)
PROCEDURE SUMMARY: ? ?Successful US guided therapeutic paracentesis from RLQ.  ?Yielded 2 L of clear, yellow fluid.  ?No immediate complications.  ?Pt tolerated well.  ? ?Specimen not sent for labs. ? ?EBL < 1 mL ? ?Tyson Alias, AGNP ?01/07/2022 ?1:13 PM ?  ?

## 2022-01-07 NOTE — Progress Notes (Signed)
?  [place] ?OFFICE PROGRESS NOTE ? ? ?Diagnosis: Cholangiocarcinoma ? ?INTERVAL HISTORY:  ? ?[name] returns prior to scheduled follow-up for evaluation of nodules located in the bilateral groin region.  She initially noted nodules in the groin regions 2 weeks ago.  No significant change.  She had an episode of hand tremors and cold chills yesterday.  No fever.  She denies any urinary symptoms. ? ?Objective: ? ?Vital signs in last 24 hours: ? ?Blood pressure (!) 113/53, pulse 84, temperature 97.8 ?F (36.6 ?C), resp. rate 18, height $RemoveBe'5\' 6"'btMofjAOX$  (1.676 m), weight 125 lb 9.6 oz (57 kg), SpO2 100 %. ?  ? ?Lymphatics: Shotty bilateral inguinal lymph nodes. ?Resp: Lungs clear bilaterally. ?Cardio: Regular rate and rhythm. ?GI: Abdomen is distended consistent with ascites. ?Vascular: No leg edema. ?Skin: Fullness bilateral inguinal regions ?Port-A-Cath without erythema. ? ?Lab Results: ? ?Lab Results  ?Component Value Date  ? WBC 13.2 (H) 12/05/2021  ? HGB 11.4 (L) 12/05/2021  ? HCT 36.2 12/05/2021  ? MCV 91.6 12/05/2021  ? PLT 290 12/05/2021  ? NEUTROABS 10.7 (H) 12/05/2021  ? ? ?Imaging: ? ?No results found. ? ?Medications: I have reviewed the patient's current medications. ? 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 ?CT abdomen/pelvis 12/21/2020-large right liver mass with peripheral intrahepatic biliary duct dilatation in the right liver, expansile tumor thrombus occluding the main, left, and right portal veins, mild porta hepatis lymphadenopathy, indeterminate 2 cm hypodense right renal lesion, small volume ascites ?MRI abdomen 12/30/2020-9.4 x 6.8 cm liver mass centered in segment 5 with involvement of segments 4A and 8, peripheral intrahepatic biliary ductal dilatation, expansile occlusive tumor thrombus in the main, left, right portal veins, mass characterized as LR-M, solitary 0.9 cm segment 2 lesion suspicious for metastasis, mildly enlarged  portacaval node, small volume ascites, right renal cortical mass-renal cell carcinoma not excluded ?Ultrasound-guided biopsy of right liver mass on 01/09/2021-pathology refer to [facility]- carcinoma with predominantly glandular and small foci of hepatocellular differentiation cholangiocarcinoma favored, NGS-IDH1 alteration, MSS, tumor mutation burden 3 ?Cycle 1 gemcitabine/cisplatin 04/13/2021, day 8 04/19/2021 ?Cycle 2 gemcitabine/cisplatin 05/02/2021, treatment changed to an every 2-week schedule ?Gemcitabine alone 05/02/2021 ?Chemotherapy discontinued per patient preference ?CT abdomen/pelvis 07/22/2021-moderate ascites, enlargement of ill marginated liver mass occupying the right liver and extending to the caudate with tumor extension into the hepatic veins and right portal vein ?Therapeutic paracentesis, last 12/24/2021 ?  ?Addison's disease ?Anorexia/weight loss secondary to #1 ?Family history of ovarian cancer ?Hypothyroidism ?Intermittent diarrhea-related to the right liver mass? ?Report of intermittent small-volume rectal bleeding ?Severe fatigue/malaise, question related to cisplatin/chemotherapy versus cancer-improved ? ?Disposition: [name] appears unchanged.  The fullness in the bilateral groin is likely inguinal hernias due to ascites.  She is scheduled for a paracentesis later today. ? ?She had an episode of chills and hand tremors yesterday.  She will submit a urine specimen today. ? ?She will return for follow-up in 6 weeks.  We are available to see her sooner if needed. ? ?Patient seen with Dr. Benay Spice. ? ?[name] ANP/GNP-BC  ? ?01/07/2022  ?9:00 AM ?This was a shared visit with [name].  [name] was interviewed and examined.  She has clinical evidence of progressive ascites.  She is scheduled for paracentesis today.  She would like to continue follow-up at the Cancer center. ? ?I was present for greater than 50% of today's visit.  I performed medical decision making. ? ?[name] Manson,  MD ? ? ? ? ? ? ?

## 2022-01-07 NOTE — Telephone Encounter (Signed)
-----   Message from Owens Shark, NP sent at 01/07/2022 10:05 AM EDT ----- ?Please let her daughter know the urinalysis was unremarkable.  There was not enough urine for the culture.  Please contact the office if symptoms recur. ? ?

## 2022-01-07 NOTE — Telephone Encounter (Signed)
Called the daughter and the daughter gave verbal understanding and denied further questions ?

## 2022-01-15 ENCOUNTER — Other Ambulatory Visit: Payer: Self-pay

## 2022-01-15 ENCOUNTER — Encounter: Payer: Self-pay | Admitting: Oncology

## 2022-01-15 DIAGNOSIS — C221 Intrahepatic bile duct carcinoma: Secondary | ICD-10-CM

## 2022-01-17 ENCOUNTER — Ambulatory Visit (HOSPITAL_COMMUNITY)
Admission: RE | Admit: 2022-01-17 | Discharge: 2022-01-17 | Disposition: A | Discharge status: Home / Self Care | Source: Ambulatory Visit | Attending: Oncology | Admitting: Oncology

## 2022-01-17 DIAGNOSIS — C221 Intrahepatic bile duct carcinoma: Secondary | ICD-10-CM | POA: Insufficient documentation

## 2022-01-17 DIAGNOSIS — R188 Other ascites: Secondary | ICD-10-CM | POA: Insufficient documentation

## 2022-01-17 HISTORY — PX: IR PARACENTESIS: IMG2679

## 2022-01-17 MED ORDER — LIDOCAINE HCL 1 % IJ SOLN
INTRAMUSCULAR | Status: AC
  Administered 2022-01-17: 10 mL
  Filled 2022-01-17: qty 20

## 2022-01-17 NOTE — Procedures (Signed)
PROCEDURE SUMMARY: ? ?Successful US guided therapeutic paracentesis from RLQ.  ?Yielded 2.2 L of clear, yellow fluid.  ?No immediate complications.  ?Pt tolerated well.  ? ?Specimen not sent for labs. ? ?EBL < 1 mL ? ?Tyson Alias, AGNP ?01/17/2022 ?10:48 AM ? ? ?

## 2022-01-22 ENCOUNTER — Inpatient Hospital Stay: Payer: Medicare Other | Admitting: Oncology

## 2022-01-23 ENCOUNTER — Encounter: Payer: Self-pay | Admitting: Oncology

## 2022-01-23 ENCOUNTER — Other Ambulatory Visit: Payer: Self-pay | Admitting: *Deleted

## 2022-01-23 DIAGNOSIS — C221 Intrahepatic bile duct carcinoma: Secondary | ICD-10-CM

## 2022-01-23 NOTE — Progress Notes (Signed)
Daughter requesting paracentesis on Friday ~ 10 am if possible via Mychart message. ?[facility] not available. Scheduled for [facility] at 0930/1000. Daughter notified via [place]. ? ?

## 2022-01-25 ENCOUNTER — Ambulatory Visit (HOSPITAL_COMMUNITY)
Admission: RE | Admit: 2022-01-25 | Discharge: 2022-01-25 | Disposition: A | Discharge status: Home / Self Care | Source: Ambulatory Visit | Attending: Oncology | Admitting: Oncology

## 2022-01-25 DIAGNOSIS — C221 Intrahepatic bile duct carcinoma: Secondary | ICD-10-CM | POA: Insufficient documentation

## 2022-01-25 MED ORDER — LIDOCAINE HCL 1 % IJ SOLN
INTRAMUSCULAR | Status: AC
  Administered 2022-01-25: 10 mL
  Filled 2022-01-25: qty 20

## 2022-01-25 NOTE — Procedures (Signed)
Ultrasound-guided  therapeutic paracentesis performed yielding 2 liters (maximum ordered) of yellow fluid. No immediate complications. EBL < 2 cc. ? ?

## 2022-01-28 ENCOUNTER — Encounter: Payer: Self-pay | Admitting: Oncology

## 2022-01-29 ENCOUNTER — Other Ambulatory Visit: Payer: Self-pay

## 2022-01-29 DIAGNOSIS — C221 Intrahepatic bile duct carcinoma: Secondary | ICD-10-CM

## 2022-02-01 ENCOUNTER — Ambulatory Visit (HOSPITAL_COMMUNITY)
Admission: RE | Admit: 2022-02-01 | Discharge: 2022-02-01 | Disposition: A | Discharge status: Home / Self Care | Source: Ambulatory Visit | Attending: Nurse Practitioner | Admitting: Nurse Practitioner

## 2022-02-01 DIAGNOSIS — R188 Other ascites: Secondary | ICD-10-CM | POA: Diagnosis not present

## 2022-02-01 DIAGNOSIS — C221 Intrahepatic bile duct carcinoma: Secondary | ICD-10-CM | POA: Insufficient documentation

## 2022-02-01 HISTORY — PX: IR PARACENTESIS: IMG2679

## 2022-02-01 MED ORDER — LIDOCAINE HCL 1 % IJ SOLN
INTRAMUSCULAR | Status: AC
  Administered 2022-02-01: 10 mL
  Filled 2022-02-01: qty 20

## 2022-02-04 ENCOUNTER — Encounter: Payer: Self-pay | Admitting: Oncology

## 2022-02-05 ENCOUNTER — Other Ambulatory Visit: Payer: Self-pay

## 2022-02-05 DIAGNOSIS — C221 Intrahepatic bile duct carcinoma: Secondary | ICD-10-CM

## 2022-02-07 ENCOUNTER — Telehealth: Payer: Self-pay

## 2022-02-07 ENCOUNTER — Other Ambulatory Visit: Payer: Self-pay

## 2022-02-07 DIAGNOSIS — C221 Intrahepatic bile duct carcinoma: Secondary | ICD-10-CM

## 2022-02-07 MED ORDER — ONDANSETRON HCL 8 MG PO TABS: 8.0000 mg | ORAL_TABLET | Freq: Three times a day (TID) | ORAL | 1 refills | Status: AC | PRN

## 2022-02-07 NOTE — Telephone Encounter (Signed)
Jessica from [employer] called and stated the patient is nausea and her compazine is not working. Janett Billow request a refill of her Zofran. I discuss the matter with NP. Per to [facility] it was ok to refill the Zofran. I called Janett Billow to let her know the script was sent in.   ?

## 2022-02-11 ENCOUNTER — Ambulatory Visit (HOSPITAL_COMMUNITY)
Admission: RE | Admit: 2022-02-11 | Discharge: 2022-02-11 | Disposition: A | Discharge status: Home / Self Care | Source: Ambulatory Visit | Attending: Nurse Practitioner | Admitting: Nurse Practitioner

## 2022-02-11 DIAGNOSIS — C221 Intrahepatic bile duct carcinoma: Secondary | ICD-10-CM | POA: Insufficient documentation

## 2022-02-11 DIAGNOSIS — R188 Other ascites: Secondary | ICD-10-CM | POA: Insufficient documentation

## 2022-02-11 HISTORY — PX: IR PARACENTESIS: IMG2679

## 2022-02-11 MED ORDER — LIDOCAINE HCL 1 % IJ SOLN
INTRAMUSCULAR | Status: AC
  Administered 2022-02-11: 10 mL
  Filled 2022-02-11: qty 20

## 2022-02-11 NOTE — Procedures (Signed)
PROCEDURE SUMMARY: ? ?Successful image-guided paracentesis from the right lower abdomen.  ?Yielded 3.8 liters of hazy yellow fluid.  ?No immediate complications.  ?EBL = trace. ?Patient tolerated well.  ? ?Specimen was not sent for labs. ? ?Please see imaging section of Epic for full dictation. ? ? ?Ngoc Daughtridge H Tyshon Fanning PA-C ?02/11/2022 ?10:13 AM ? ? ? ?

## 2022-02-15 ENCOUNTER — Encounter: Payer: Self-pay | Admitting: Oncology

## 2022-02-15 ENCOUNTER — Telehealth: Payer: Self-pay | Admitting: *Deleted

## 2022-02-15 DIAGNOSIS — C221 Intrahepatic bile duct carcinoma: Secondary | ICD-10-CM

## 2022-02-15 NOTE — Telephone Encounter (Signed)
Mychart message from daughter requesting paracentesis at [facility] on 5/10 after 10 am. Order placed and scheduled and daughter notified via [place]. ?

## 2022-02-19 ENCOUNTER — Inpatient Hospital Stay: Payer: Medicare Other | Attending: Nurse Practitioner | Admitting: Oncology

## 2022-02-19 VITALS — BP 122/77 | HR 77 | Temp 98.2°F | Resp 18 | Ht 66.0 in | Wt 130.6 lb

## 2022-02-19 DIAGNOSIS — Z8041 Family history of malignant neoplasm of ovary: Secondary | ICD-10-CM | POA: Insufficient documentation

## 2022-02-19 DIAGNOSIS — K625 Hemorrhage of anus and rectum: Secondary | ICD-10-CM | POA: Insufficient documentation

## 2022-02-19 DIAGNOSIS — E271 Primary adrenocortical insufficiency: Secondary | ICD-10-CM | POA: Insufficient documentation

## 2022-02-19 DIAGNOSIS — R634 Abnormal weight loss: Secondary | ICD-10-CM | POA: Insufficient documentation

## 2022-02-19 DIAGNOSIS — E039 Hypothyroidism, unspecified: Secondary | ICD-10-CM | POA: Insufficient documentation

## 2022-02-19 DIAGNOSIS — C221 Intrahepatic bile duct carcinoma: Secondary | ICD-10-CM | POA: Insufficient documentation

## 2022-02-19 NOTE — Progress Notes (Signed)
?[place] ?OFFICE PROGRESS NOTE ? ? ?Diagnosis: Cholangiocarcinoma ? ?INTERVAL HISTORY:  ? ?Ms. Jett returns as scheduled.  No pain.  The abdomen remains distended.  She last underwent a paracentesis for 3.8 L on 02/11/2022.  She is scheduled for paracentesis tomorrow.  She is mobile in the home.  The hospice nurse is visiting weekly.  The hospice nurse is flushing the Port-A-Cath. ? ?Objective: ? ?Vital signs in last 24 hours: ? ?Blood pressure 122/77, pulse 77, temperature 98.2 ?F (36.8 ?C), temperature source Oral, resp. rate 18, height _0  (1.676 m), weight 130 lb 9.6 oz (59.2 kg), SpO2 96 %. ?  ?Resp: Lungs clear bilaterally ?Cardio: Regular rate and rhythm ?GI: Distended with ascites, the liver edge is palpable in the right upper abdomen, nontender ?Vascular: No leg edema, bilateral leg varicosities ?  ? ?Portacath/PICC-without erythema ? ?Lab Results: ? ?Lab Results  ?Component Value Date  ? WBC 13.2 (H) 12/05/2021  ? HGB 11.4 (L) 12/05/2021  ? HCT 36.2 12/05/2021  ? MCV 91.6 12/05/2021  ? PLT 290 12/05/2021  ? NEUTROABS 10.7 (H) 12/05/2021  ? ? ?CMP  ?Lab Results  ?Component Value Date  ? NA 135 12/05/2021  ? K 3.6 12/05/2021  ? CL 98 12/05/2021  ? CO2 27 12/05/2021  ? GLUCOSE 121 (H) 12/05/2021  ? BUN 21 12/05/2021  ? CREATININE 0.58 12/05/2021  ? CALCIUM 9.7 12/05/2021  ? PROT 6.4 (L) 12/05/2021  ? ALBUMIN 3.3 (L) 12/05/2021  ? AST 67 (H) 12/05/2021  ? ALT 29 12/05/2021  ? ALKPHOS 404 (H) 12/05/2021  ? BILITOT 1.0 12/05/2021  ? GFRNONAA >60 12/05/2021  ? GFRAA 64 (L) 11/03/2012  ? ? ? ?Medications: I have reviewed the patient's current medications. ? ? ?Assessment/Plan: ? 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 ?CT abdomen/pelvis 12/21/2020-large right liver mass with peripheral intrahepatic biliary duct dilatation in the right liver, expansile tumor thrombus occluding the main, left, and right portal veins, mild porta hepatis lymphadenopathy,  indeterminate 2 cm hypodense right renal lesion, small volume ascites ?MRI abdomen 12/30/2020-9.4 x 6.8 cm liver mass centered in segment 5 with involvement of segments 4A and 8, peripheral intrahepatic biliary ductal dilatation, expansile occlusive tumor thrombus in the main, left, right portal veins, mass characterized as LR-M, solitary 0.9 cm segment 2 lesion suspicious for metastasis, mildly enlarged portacaval node, small volume ascites, right renal cortical mass-renal cell carcinoma not excluded ?Ultrasound-guided biopsy of right liver mass on 01/09/2021-pathology refer to [facility]- carcinoma with predominantly glandular and small foci of hepatocellular differentiation cholangiocarcinoma favored, NGS-IDH1 alteration, MSS, tumor mutation burden 3 ?Cycle 1 gemcitabine/cisplatin 04/13/2021, day 8 04/19/2021 ?Cycle 2 gemcitabine/cisplatin 05/02/2021, treatment changed to an every 2-week schedule ?Gemcitabine alone 05/02/2021 ?Chemotherapy discontinued per patient preference ?CT abdomen/pelvis 07/22/2021-moderate ascites, enlargement of ill marginated liver mass occupying the right liver and extending to the caudate with tumor extension into the hepatic veins and right portal vein ?Therapeutic paracentesis, last 02/11/2022 ?  ?Addison's disease ?Anorexia/weight loss secondary to #1 ?Family history of ovarian cancer ?Hypothyroidism ?Intermittent diarrhea-related to the right liver mass? ?Report of intermittent small-volume rectal bleeding ?Severe fatigue/malaise, question related to cisplatin/chemotherapy versus cancer-improved ? ? ?Disposition: ?[name] has a history of locally advanced cholangiocarcinoma.  She is enrolled in home hospice care.  She will continue to have palliative paracentesis procedures as needed.  She would like to continue follow-up at the Cancer center.  She will return for an office visit in 6 weeks.  We are available  to see her in the interim as needed. ? ?Betsy Coder, MD ? ?02/19/2022  ?11:25 AM ? ? ?

## 2022-02-20 ENCOUNTER — Encounter: Payer: Self-pay | Admitting: Oncology

## 2022-02-20 ENCOUNTER — Ambulatory Visit (HOSPITAL_COMMUNITY)
Admission: RE | Admit: 2022-02-20 | Discharge: 2022-02-20 | Disposition: A | Discharge status: Home / Self Care | Source: Ambulatory Visit | Attending: Nurse Practitioner | Admitting: Nurse Practitioner

## 2022-02-20 DIAGNOSIS — C221 Intrahepatic bile duct carcinoma: Secondary | ICD-10-CM | POA: Insufficient documentation

## 2022-02-20 DIAGNOSIS — R188 Other ascites: Secondary | ICD-10-CM | POA: Diagnosis not present

## 2022-02-20 HISTORY — PX: IR PARACENTESIS: IMG2679

## 2022-02-20 MED ORDER — LIDOCAINE HCL 1 % IJ SOLN
INTRAMUSCULAR | Status: AC
  Administered 2022-02-20: 10 mL
  Filled 2022-02-20: qty 20

## 2022-02-20 NOTE — Procedures (Signed)
PROCEDURE SUMMARY: ? ?Successful image-guided paracentesis from the right lower abdomen.  ?Yielded 2.0 liters of clear yellow fluid.  ?No immediate complications.  ?EBL < 1 mL ?Patient tolerated well.  ? ?Specimen was not sent for labs. ? ?Please see imaging section of Epic for full dictation. ? ?Joaquim Nam PA-C ?02/20/2022 ?2:50 PM ? ? ? ?

## 2022-02-21 ENCOUNTER — Other Ambulatory Visit: Payer: Self-pay | Admitting: *Deleted

## 2022-02-21 DIAGNOSIS — C221 Intrahepatic bile duct carcinoma: Secondary | ICD-10-CM

## 2022-02-21 NOTE — Progress Notes (Signed)
Daughter requesting repeat paracentesis on 5/17 and increase fluid limit. Per Dr. Benay Spice, [place] to increase fluid draw to 4 liters. Scheduled para and daughter notified via [facility]. ?

## 2022-02-27 ENCOUNTER — Ambulatory Visit (HOSPITAL_COMMUNITY)
Admission: RE | Admit: 2022-02-27 | Discharge: 2022-02-27 | Disposition: A | Discharge status: Home / Self Care | Source: Ambulatory Visit | Attending: Oncology | Admitting: Oncology

## 2022-02-27 DIAGNOSIS — R188 Other ascites: Secondary | ICD-10-CM | POA: Diagnosis not present

## 2022-02-27 DIAGNOSIS — C221 Intrahepatic bile duct carcinoma: Secondary | ICD-10-CM | POA: Diagnosis present

## 2022-02-27 HISTORY — PX: IR PARACENTESIS: IMG2679

## 2022-02-27 MED ORDER — LIDOCAINE HCL 1 % IJ SOLN
INTRAMUSCULAR | Status: AC
  Filled 2022-02-27: qty 20

## 2022-02-27 MED ORDER — LIDOCAINE HCL (PF) 1 % IJ SOLN
INTRAMUSCULAR | Status: DC | PRN
  Administered 2022-02-27: 5 mL

## 2022-02-27 NOTE — Procedures (Signed)
PROCEDURE SUMMARY: ? ?Successful [place] therapeutic guided paracentesis from RLQ.  ?Yielded 3.7 L of clear, yellow fluid.  ?No immediate complications.  ?Pt tolerated well.  ? ?Specimen not sent for labs. ?Please see imaging section of Epic for full dictation. ? ?EBL < 1 mL ? ?Tyson Alias, AGNP ?02/27/2022 ?2:15 PM ?  ?

## 2022-02-28 ENCOUNTER — Other Ambulatory Visit: Payer: Self-pay

## 2022-02-28 ENCOUNTER — Encounter: Payer: Self-pay | Admitting: Oncology

## 2022-02-28 DIAGNOSIS — C221 Intrahepatic bile duct carcinoma: Secondary | ICD-10-CM

## 2022-03-08 ENCOUNTER — Ambulatory Visit (HOSPITAL_COMMUNITY)
Admission: RE | Admit: 2022-03-08 | Discharge: 2022-03-08 | Disposition: A | Discharge status: Home / Self Care | Source: Ambulatory Visit | Attending: Oncology | Admitting: Oncology

## 2022-03-08 DIAGNOSIS — R188 Other ascites: Secondary | ICD-10-CM | POA: Insufficient documentation

## 2022-03-08 DIAGNOSIS — C221 Intrahepatic bile duct carcinoma: Secondary | ICD-10-CM | POA: Insufficient documentation

## 2022-03-08 HISTORY — PX: IR PARACENTESIS: IMG2679

## 2022-03-08 MED ORDER — LIDOCAINE HCL (PF) 1 % IJ SOLN
INTRAMUSCULAR | Status: DC | PRN
  Administered 2022-03-08: 10 mL

## 2022-03-08 MED ORDER — LIDOCAINE HCL 1 % IJ SOLN
INTRAMUSCULAR | Status: AC
  Filled 2022-03-08: qty 20

## 2022-03-08 NOTE — Procedures (Signed)
PROCEDURE SUMMARY:  Successful US guided therapeutic paracentesis from RLQ.  Yielded 2.2 L of clear, yellow fluid.  No immediate complications.  Pt tolerated well.   Specimen not sent for labs.  EBL < 1 mL  Tyson Alias, AGNP 03/08/2022 2:39 PM

## 2022-03-11 ENCOUNTER — Encounter: Payer: Self-pay | Admitting: Oncology

## 2022-03-12 ENCOUNTER — Other Ambulatory Visit: Payer: Self-pay

## 2022-03-12 DIAGNOSIS — C221 Intrahepatic bile duct carcinoma: Secondary | ICD-10-CM

## 2022-03-15 ENCOUNTER — Ambulatory Visit (HOSPITAL_COMMUNITY)
Admission: RE | Admit: 2022-03-15 | Discharge: 2022-03-15 | Disposition: A | Discharge status: Home / Self Care | Source: Ambulatory Visit | Attending: Oncology | Admitting: Oncology

## 2022-03-15 DIAGNOSIS — R188 Other ascites: Secondary | ICD-10-CM | POA: Diagnosis not present

## 2022-03-15 DIAGNOSIS — C221 Intrahepatic bile duct carcinoma: Secondary | ICD-10-CM | POA: Insufficient documentation

## 2022-03-15 HISTORY — PX: IR PARACENTESIS: IMG2679

## 2022-03-15 MED ORDER — LIDOCAINE HCL 1 % IJ SOLN
INTRAMUSCULAR | Status: AC
  Administered 2022-03-15: 10 mL
  Filled 2022-03-15: qty 20

## 2022-03-15 NOTE — Procedures (Signed)
PROCEDURE SUMMARY:  Successful US guided paracentesis from right lateral abdomen.  Yielded 2.4 liters of clear yellow fluid.  No immediate complications.  Patient tolerated well.  EBL = trace Deshante Cassell S Sayaka Hoeppner PA-C 03/15/2022 2:20 PM

## 2022-03-16 ENCOUNTER — Encounter: Payer: Self-pay | Admitting: Oncology

## 2022-03-18 ENCOUNTER — Other Ambulatory Visit: Payer: Self-pay | Admitting: *Deleted

## 2022-03-18 DIAGNOSIS — C221 Intrahepatic bile duct carcinoma: Secondary | ICD-10-CM

## 2022-03-18 NOTE — Progress Notes (Signed)
Daughter sent Mychart message requesting paracentesis on 03/22/22 in afternoon. Scheduled at 1:30/2:00 pm at [facility] on 03/22/22 and daughter notified.

## 2022-03-22 ENCOUNTER — Ambulatory Visit (HOSPITAL_COMMUNITY)
Admission: RE | Admit: 2022-03-22 | Discharge: 2022-03-22 | Disposition: A | Discharge status: Home / Self Care | Source: Ambulatory Visit | Attending: Oncology | Admitting: Oncology

## 2022-03-22 DIAGNOSIS — C221 Intrahepatic bile duct carcinoma: Secondary | ICD-10-CM | POA: Diagnosis not present

## 2022-03-22 DIAGNOSIS — R188 Other ascites: Secondary | ICD-10-CM | POA: Diagnosis not present

## 2022-03-22 HISTORY — PX: IR PARACENTESIS: IMG2679

## 2022-03-22 NOTE — Procedures (Signed)
PROCEDURE SUMMARY:  Successful US guided paracentesis from cholangiocarcinoma, recurrent ascites.  Yielded 1.2L of clear ascitic fluid.  No immediate complications.  Pt tolerated well.   Specimen not sent for labs.  EBL < 71m  Bowman Higbie PA-C 03/22/2022 3:30 PM

## 2022-03-25 ENCOUNTER — Encounter: Payer: Self-pay | Admitting: Oncology

## 2022-03-26 ENCOUNTER — Other Ambulatory Visit: Payer: Self-pay

## 2022-03-26 DIAGNOSIS — C221 Intrahepatic bile duct carcinoma: Secondary | ICD-10-CM

## 2022-03-28 ENCOUNTER — Ambulatory Visit (HOSPITAL_COMMUNITY)
Admission: RE | Admit: 2022-03-28 | Discharge: 2022-03-28 | Disposition: A | Discharge status: Home / Self Care | Source: Ambulatory Visit | Attending: Nurse Practitioner | Admitting: Nurse Practitioner

## 2022-03-28 DIAGNOSIS — R188 Other ascites: Secondary | ICD-10-CM | POA: Insufficient documentation

## 2022-03-28 DIAGNOSIS — C221 Intrahepatic bile duct carcinoma: Secondary | ICD-10-CM | POA: Insufficient documentation

## 2022-03-28 HISTORY — PX: IR PARACENTESIS: IMG2679

## 2022-03-28 MED ORDER — LIDOCAINE HCL 1 % IJ SOLN
INTRAMUSCULAR | Status: AC
  Administered 2022-03-28: 10 mL
  Filled 2022-03-28: qty 20

## 2022-03-28 NOTE — Procedures (Signed)
PROCEDURE SUMMARY:  Successful image-guided paracentesis from the right lower abdomen.  Yielded 2.5 liters of hazy yellow fluid.  No immediate complications.  EBL = trace. Patient tolerated well.   Specimen was not sent for labs.  Please see imaging section of Epic for full dictation.   Jazlyne Gauger Lemmie Evens Kayler Buckholtz PA-C 03/28/2022 10:07 AM

## 2022-04-01 ENCOUNTER — Encounter: Payer: Self-pay | Admitting: Oncology

## 2022-04-01 ENCOUNTER — Other Ambulatory Visit: Payer: Self-pay

## 2022-04-01 DIAGNOSIS — C221 Intrahepatic bile duct carcinoma: Secondary | ICD-10-CM

## 2022-04-02 ENCOUNTER — Inpatient Hospital Stay: Payer: Medicare Other | Admitting: Oncology

## 2022-04-04 ENCOUNTER — Ambulatory Visit (HOSPITAL_COMMUNITY)
Admission: RE | Admit: 2022-04-04 | Discharge: 2022-04-04 | Disposition: A | Discharge status: Home / Self Care | Source: Ambulatory Visit | Attending: Oncology | Admitting: Oncology

## 2022-04-04 DIAGNOSIS — C221 Intrahepatic bile duct carcinoma: Secondary | ICD-10-CM | POA: Diagnosis present

## 2022-04-04 DIAGNOSIS — R188 Other ascites: Secondary | ICD-10-CM | POA: Diagnosis not present

## 2022-04-04 HISTORY — PX: IR PARACENTESIS: IMG2679

## 2022-04-04 MED ORDER — LIDOCAINE HCL 1 % IJ SOLN
INTRAMUSCULAR | Status: AC
  Administered 2022-04-04: 10 mL
  Filled 2022-04-04: qty 20

## 2022-04-04 MED ORDER — LIDOCAINE HCL (PF) 1 % IJ SOLN
INTRAMUSCULAR | Status: DC | PRN
Start: 2022-04-04 — End: 2022-04-05
  Administered 2022-04-04: 5 mL

## 2022-04-06 ENCOUNTER — Other Ambulatory Visit: Payer: Self-pay

## 2022-04-06 ENCOUNTER — Encounter (HOSPITAL_BASED_OUTPATIENT_CLINIC_OR_DEPARTMENT_OTHER): Payer: Self-pay

## 2022-04-06 ENCOUNTER — Emergency Department (HOSPITAL_BASED_OUTPATIENT_CLINIC_OR_DEPARTMENT_OTHER)
Admission: EM | Admit: 2022-04-06 | Discharge: 2022-04-06 | Disposition: A | Discharge status: Home / Self Care | Attending: Emergency Medicine | Admitting: Emergency Medicine

## 2022-04-06 DIAGNOSIS — R140 Abdominal distension (gaseous): Secondary | ICD-10-CM | POA: Insufficient documentation

## 2022-04-06 DIAGNOSIS — Z8505 Personal history of malignant neoplasm of liver: Secondary | ICD-10-CM | POA: Insufficient documentation

## 2022-04-06 DIAGNOSIS — R109 Unspecified abdominal pain: Secondary | ICD-10-CM | POA: Diagnosis present

## 2022-04-06 DIAGNOSIS — E039 Hypothyroidism, unspecified: Secondary | ICD-10-CM | POA: Diagnosis not present

## 2022-04-06 LAB — CBC WITH DIFFERENTIAL/PLATELET
Abs Immature Granulocytes: 0.09 10*3/uL — ABNORMAL HIGH (ref 0.00–0.07)
Basophils Absolute: 0.1 10*3/uL (ref 0.0–0.1)
Basophils Relative: 1 %
Eosinophils Absolute: 0.1 10*3/uL (ref 0.0–0.5)
Eosinophils Relative: 0 %
HCT: 35.8 % — ABNORMAL LOW (ref 36.0–46.0)
Hemoglobin: 11.4 g/dL — ABNORMAL LOW (ref 12.0–15.0)
Immature Granulocytes: 1 %
Lymphocytes Relative: 12 %
Lymphs Abs: 1.5 10*3/uL (ref 0.7–4.0)
MCH: 27.9 pg (ref 26.0–34.0)
MCHC: 31.8 g/dL (ref 30.0–36.0)
MCV: 87.7 fL (ref 80.0–100.0)
Monocytes Absolute: 1.1 10*3/uL — ABNORMAL HIGH (ref 0.1–1.0)
Monocytes Relative: 9 %
Neutro Abs: 9.9 10*3/uL — ABNORMAL HIGH (ref 1.7–7.7)
Neutrophils Relative %: 77 %
Platelets: 241 10*3/uL (ref 150–400)
RBC: 4.08 MIL/uL (ref 3.87–5.11)
RDW: 16.6 % — ABNORMAL HIGH (ref 11.5–15.5)
WBC: 12.7 10*3/uL — ABNORMAL HIGH (ref 4.0–10.5)
nRBC: 0 % (ref 0.0–0.2)

## 2022-04-06 LAB — COMPREHENSIVE METABOLIC PANEL
ALT: 21 U/L (ref 0–44)
AST: 38 U/L (ref 15–41)
Albumin: 3.2 g/dL — ABNORMAL LOW (ref 3.5–5.0)
Alkaline Phosphatase: 282 U/L — ABNORMAL HIGH (ref 38–126)
Anion gap: 12 (ref 5–15)
BUN: 24 mg/dL — ABNORMAL HIGH (ref 8–23)
CO2: 26 mmol/L (ref 22–32)
Calcium: 10.3 mg/dL (ref 8.9–10.3)
Chloride: 101 mmol/L (ref 98–111)
Creatinine, Ser: 0.59 mg/dL (ref 0.44–1.00)
GFR, Estimated: >60 mL/min (ref >60)
Glucose, Bld: 105 mg/dL — ABNORMAL HIGH (ref 70–99)
Potassium: 3.3 mmol/L — ABNORMAL LOW (ref 3.5–5.1)
Sodium: 139 mmol/L (ref 135–145)
Total Bilirubin: 0.5 mg/dL (ref 0.3–1.2)
Total Protein: 6.2 g/dL — ABNORMAL LOW (ref 6.5–8.1)

## 2022-04-06 MED ORDER — OXYCODONE HCL 5 MG PO TABS
5.0000 mg | ORAL_TABLET | Freq: Once | ORAL | Status: AC
  Administered 2022-04-06: 5 mg via ORAL
  Filled 2022-04-06: qty 1

## 2022-04-06 MED ORDER — OXYCODONE HCL 5 MG PO TABS: 5.0000 mg | ORAL_TABLET | Freq: Four times a day (QID) | ORAL | 0 refills | Status: DC | PRN

## 2022-04-06 MED ORDER — SENNOSIDES-DOCUSATE SODIUM 8.6-50 MG PO TABS: 1.0000 | ORAL_TABLET | Freq: Every evening | ORAL | 0 refills | Status: AC | PRN

## 2022-04-06 NOTE — ED Provider Notes (Signed)
Emergency Department Provider Note   I have reviewed the triage vital signs and the nursing notes.   HISTORY  Chief Complaint Abdominal Pain   HPI [name] is a 88 y.o. female presents emergency room for evaluation of abdominal discomfort.  Patient with liver cancer under hospice care.  She had a paracentesis with 2.6 L taken off last week.  She has a scheduled weekly.  No fevers.  She continues to have bowel movements.  She has not had any confusion.  She is accompanied by family.  Patient denies any cough, congestion, chest discomfort.   Past Medical History:  Diagnosis Date   Addison's disease ([place])    Hypothyroidism    Osteoporosis     Review of Systems  Constitutional: No fever/chills Cardiovascular: Denies chest pain. Respiratory: Denies shortness of breath. Gastrointestinal: Positive abdominal pain.  No nausea, no vomiting.  No diarrhea.  No constipation. Genitourinary: Negative for dysuria. Musculoskeletal: Negative for back pain. Skin: Negative for rash. Neurological: Negative for headaches. ____________________________________________   PHYSICAL EXAM:  VITAL SIGNS: ED Triage Vitals [04/06/22 1951]  Enc Vitals Group     BP (!) 115/54     Pulse Rate 81     Resp 19     Temp 98.1 F (36.7 C)     Temp src      SpO2 96 %     Weight 125 lb (56.7 kg)     Height '5\' 6"'$  (1.676 m)   Constitutional: Alert and oriented. Well appearing and in no acute distress. Eyes: Conjunctivae are normal.  Head: Atraumatic. Nose: No congestion/rhinnorhea. Mouth/Throat: Mucous membranes are moist. Neck: No stridor.   Cardiovascular: Normal rate, regular rhythm. Good peripheral circulation. Grossly normal heart sounds.   Respiratory: Normal respiratory effort.  No retractions. Lungs CTAB. Gastrointestinal: Soft and nontender. Positive distention.  Musculoskeletal: No lower extremity tenderness nor edema. No gross deformities of extremities. Neurologic:  Normal speech  and language. No gross focal neurologic deficits are appreciated.  Skin:  Skin is warm, dry and intact. No rash noted.  ____________________________________________   LABS (all labs ordered are listed, but only abnormal results are displayed)  Labs Reviewed  COMPREHENSIVE METABOLIC PANEL - Abnormal; Notable for the following components:      Result Value   Potassium 3.3 (*)    Glucose, Bld 105 (*)    BUN 24 (*)    Total Protein 6.2 (*)    Albumin 3.2 (*)    Alkaline Phosphatase 282 (*)    All other components within normal limits  CBC WITH DIFFERENTIAL/PLATELET - Abnormal; Notable for the following components:   WBC 12.7 (*)    Hemoglobin 11.4 (*)    HCT 35.8 (*)    RDW 16.6 (*)    Neutro Abs 9.9 (*)    Monocytes Absolute 1.1 (*)    Abs Immature Granulocytes 0.09 (*)    All other components within normal limits   ____________________________________________   PROCEDURES  Procedure(s) performed:   Procedures  None  ____________________________________________   INITIAL IMPRESSION / ASSESSMENT AND PLAN / ED COURSE  Pertinent labs & imaging results that were available during my care of the patient were reviewed by me and considered in my medical decision making (see chart for details).   This patient is Presenting for Evaluation of abdominal pain, which does require a range of treatment options, and is a complaint that involves a high risk of morbidity and mortality.  The Differential Diagnoses includes but is not  exclusive to acute cholecystitis, intrathoracic causes for epigastric abdominal pain, gastritis, duodenitis, pancreatitis, small bowel or large bowel obstruction, abdominal aortic aneurysm, hernia, gastritis, etc. .  Critical Interventions-    Medications  oxyCODONE (Oxy IR/ROXICODONE) immediate release tablet 5 mg (5 mg Oral Given 04/06/22 2131)    Reassessment after intervention: Pain improved.   I did obtain Additional Historical Information from  daughters at bedside.   Clinical Laboratory Tests Ordered, included mild leukocytosis of 12.7.  No acute kidney injury.  LFTs are normal  Medical Decision Making: Summary:  Patient presents emergency department for evaluation of abdominal distention.  No focal tenderness or specific pain.  Patient feels more discomfort from distention.  No charge bedside ultrasound shows ascites although bowel wall is close to the abdominal cavity.  Do not feel that the large-volume paracentesis would be possible in the emergency department.    Reevaluation with update and discussion with patient and family at bedside.  Her pain is well controlled here.  She will continue with hospice care and daughters will call on Monday for LVP scheduling.  Considered admission patient's pain is well controlled.  Low suspicion for SBP. Patient with good follow up.   Disposition: discharge  ____________________________________________  FINAL CLINICAL IMPRESSION(S) / ED DIAGNOSES  Final diagnoses:  Abdominal distension     NEW OUTPATIENT MEDICATIONS STARTED DURING THIS VISIT:  Discharge Medication List as of 04/06/2022 11:14 PM     START taking these medications   Details  senna-docusate (SENOKOT-S) 8.6-50 MG tablet Take 1 tablet by mouth at bedtime as needed for mild constipation., Starting Sat 04/06/2022, Normal        Note:  This document was prepared using Dragon voice recognition software and may include unintentional dictation errors.  Nanda Quinton, MD, [facility]    [place] Mcgrory, [place], MD 04/11/22 [phone]

## 2022-04-08 ENCOUNTER — Telehealth: Payer: Self-pay | Admitting: *Deleted

## 2022-04-08 ENCOUNTER — Encounter: Payer: Self-pay | Admitting: Oncology

## 2022-04-08 NOTE — Telephone Encounter (Signed)
Spoke with pt again and instructed pt to call Hospice nurse for reevaluation today after ER visit on Saturday as per Dr. Kalman Drape instructions.  Pt stated she would do so.

## 2022-04-09 ENCOUNTER — Other Ambulatory Visit: Payer: Self-pay | Admitting: *Deleted

## 2022-04-09 DIAGNOSIS — C221 Intrahepatic bile duct carcinoma: Secondary | ICD-10-CM

## 2022-04-10 ENCOUNTER — Ambulatory Visit (HOSPITAL_COMMUNITY)
Admission: RE | Admit: 2022-04-10 | Discharge: 2022-04-10 | Disposition: A | Discharge status: Home / Self Care | Source: Ambulatory Visit | Attending: Oncology | Admitting: Oncology

## 2022-04-10 DIAGNOSIS — R188 Other ascites: Secondary | ICD-10-CM | POA: Diagnosis not present

## 2022-04-10 DIAGNOSIS — C221 Intrahepatic bile duct carcinoma: Secondary | ICD-10-CM | POA: Insufficient documentation

## 2022-04-10 HISTORY — PX: IR PARACENTESIS: IMG2679

## 2022-04-10 MED ORDER — LIDOCAINE HCL (PF) 1 % IJ SOLN
INTRAMUSCULAR | Status: DC | PRN
  Administered 2022-04-10: 5 mL

## 2022-04-10 MED ORDER — LIDOCAINE HCL 1 % IJ SOLN
INTRAMUSCULAR | Status: AC
  Filled 2022-04-10: qty 20

## 2022-04-10 NOTE — Procedures (Signed)
PROCEDURE SUMMARY:  Successful US guided therapeutic paracentesis from RLQ.  Yielded 1.5 L of clear, yellow fluid.  No immediate complications.  Pt tolerated well.   Specimen not sent for labs.  EBL < 1 mL  Tyson Alias, AGNP 04/10/2022 3:16 PM

## 2022-04-11 ENCOUNTER — Inpatient Hospital Stay: Payer: Medicare Other | Attending: Nurse Practitioner | Admitting: Oncology

## 2022-04-11 VITALS — BP 119/57 | HR 64 | Temp 97.8°F | Resp 18 | Ht 66.0 in | Wt 125.8 lb

## 2022-04-11 DIAGNOSIS — R197 Diarrhea, unspecified: Secondary | ICD-10-CM | POA: Diagnosis not present

## 2022-04-11 DIAGNOSIS — C221 Intrahepatic bile duct carcinoma: Secondary | ICD-10-CM | POA: Insufficient documentation

## 2022-04-11 DIAGNOSIS — R634 Abnormal weight loss: Secondary | ICD-10-CM | POA: Diagnosis not present

## 2022-04-11 DIAGNOSIS — E039 Hypothyroidism, unspecified: Secondary | ICD-10-CM | POA: Diagnosis not present

## 2022-04-11 DIAGNOSIS — Z8041 Family history of malignant neoplasm of ovary: Secondary | ICD-10-CM | POA: Diagnosis not present

## 2022-04-11 DIAGNOSIS — R5383 Other fatigue: Secondary | ICD-10-CM | POA: Insufficient documentation

## 2022-04-11 DIAGNOSIS — K625 Hemorrhage of anus and rectum: Secondary | ICD-10-CM | POA: Insufficient documentation

## 2022-04-11 DIAGNOSIS — E271 Primary adrenocortical insufficiency: Secondary | ICD-10-CM | POA: Diagnosis not present

## 2022-04-11 NOTE — Progress Notes (Signed)
[place] OFFICE PROGRESS NOTE   Diagnosis: Cholangiocarcinoma  INTERVAL HISTORY:   [name] returns as scheduled.  She continues intermittent paracentesis procedures, last yesterday for 1.5 L of fluid.  She had a paracentesis on 04/04/2022 for 2.5 L of fluid.  She developed abdominal pain the following day and was seen in the emergency room on 04/06/2022.  She was prescribed oxycodone.  The pain has improved.  She takes half of an oxycodone tablet occasionally.  She is having bowel movements.  Objective:  Vital signs in last 24 hours:  Blood pressure (!) 119/57, pulse 64, temperature 97.8 F (36.6 C), temperature source Oral, resp. rate 18, height 5' 6"  (1.676 m), weight 125 lb 12.8 oz (57.1 kg), SpO2 98 %.    HEENT: No thrush Resp: Lungs clear bilaterally Cardio: Regular rate and rhythm GI: Mildly distended with ascites, the liver is palpable in the right upper abdomen Vascular: Trace ankle edema bilaterally    Portacath/PICC-without erythema  Lab Results:  Lab Results  Component Value Date   WBC 12.7 (H) 04/06/2022   HGB 11.4 (L) 04/06/2022   HCT 35.8 (L) 04/06/2022   MCV 87.7 04/06/2022   PLT 241 04/06/2022   NEUTROABS 9.9 (H) 04/06/2022    CMP  Lab Results  Component Value Date   NA 139 04/06/2022   K 3.3 (L) 04/06/2022   CL 101 04/06/2022   CO2 26 04/06/2022   GLUCOSE 105 (H) 04/06/2022   BUN 24 (H) 04/06/2022   CREATININE 0.59 04/06/2022   CALCIUM 10.3 04/06/2022   PROT 6.2 (L) 04/06/2022   ALBUMIN 3.2 (L) 04/06/2022   AST 38 04/06/2022   ALT 21 04/06/2022   ALKPHOS 282 (H) 04/06/2022   BILITOT 0.5 04/06/2022   GFRNONAA >60 04/06/2022   GFRAA 64 (L) 11/03/2012    No results found for: "CEA1", "CEA", "CAN199", "CA125"  Lab Results  Component Value Date   INR 0.9 07/22/2021   LABPROT 12.3 07/22/2021    Imaging:  IR Paracentesis  Result Date: 04/10/2022 INDICATION: History of cholangiocarcinoma with recurrent ascites.  Request for therapeutic paracentesis. EXAM: ULTRASOUND GUIDED THERAPEUTIC RIGHT LOWER QUADRANT PARACENTESIS MEDICATIONS: 10 mL 1 % lidocaine COMPLICATIONS: None immediate. PROCEDURE: Informed written consent was obtained from the patient after a discussion of the risks, benefits and alternatives to treatment. A timeout was performed prior to the initiation of the procedure. Initial ultrasound scanning demonstrates a small amount of ascites within the right lower abdominal quadrant. The right lower abdomen was prepped and draped in the usual sterile fashion. 1% lidocaine was used for local anesthesia. Following this, a 19 gauge, 7-cm, Yueh catheter was introduced. An ultrasound image was saved for documentation purposes. The paracentesis was performed. The catheter was removed and a dressing was applied. The patient tolerated the procedure well without immediate post procedural complication. FINDINGS: A total of approximately 1.5 L of clear, yellow fluid was removed. IMPRESSION: Successful ultrasound-guided paracentesis yielding 1.5 liters of peritoneal fluid. Read by: Narda Rutherford, AGNP-BC PLAN: If the patient eventually requires >/=2 paracenteses in a 30 day period, candidacy for formal evaluation by the [facility] will be assessed. Electronically Signed   By: Lucrezia Europe M.D.   On: 04/10/2022 16:11    Medications: I have reviewed the patient's current medications.   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 CT abdomen/pelvis 12/21/2020-large right liver mass with peripheral intrahepatic biliary duct dilatation in the right liver, expansile tumor thrombus occluding the  main, left, and right portal veins, mild porta hepatis lymphadenopathy, indeterminate 2 cm hypodense right renal lesion, small volume ascites MRI abdomen 12/30/2020-9.4 x 6.8 cm liver mass centered in segment 5 with involvement of segments 4A and  8, peripheral intrahepatic biliary ductal dilatation, expansile occlusive tumor thrombus in the main, left, right portal veins, mass characterized as LR-M, solitary 0.9 cm segment 2 lesion suspicious for metastasis, mildly enlarged portacaval node, small volume ascites, right renal cortical mass-renal cell carcinoma not excluded Ultrasound-guided biopsy of right liver mass on 01/09/2021-pathology refer to [facility]- carcinoma with predominantly glandular and small foci of hepatocellular differentiation cholangiocarcinoma favored, NGS-IDH1 alteration, MSS, tumor mutation burden 3 Cycle 1 gemcitabine/cisplatin 04/13/2021, day 8 04/19/2021 Cycle 2 gemcitabine/cisplatin 05/02/2021, treatment changed to an every 2-week schedule Gemcitabine alone 05/02/2021 Chemotherapy discontinued per patient preference CT abdomen/pelvis 07/22/2021-moderate ascites, enlargement of ill marginated liver mass occupying the right liver and extending to the caudate with tumor extension into the hepatic veins and right portal vein Therapeutic paracentesis, last 04/02/2022   Addison's disease Anorexia/weight loss secondary to #1 Family history of ovarian cancer Hypothyroidism Intermittent diarrhea-related to the right liver mass? Report of intermittent small-volume rectal bleeding Severe fatigue/malaise, question related to cisplatin/chemotherapy versus cancer-improved     Disposition: [name] appears unchanged.  She has intrahepatic cholangiocarcinoma.  She continues follow-up with the home hospice RN for a weekly visit.  She would like to continue follow-up at the Cancer center.  [name] will return for an office visit in 6 weeks.  She will contact us in the interim as needed.  She will continue to have palliative paracentesis procedures as needed.  Betsy Coder, MD  04/11/2022  9:08 AM

## 2022-04-15 ENCOUNTER — Other Ambulatory Visit: Payer: Self-pay

## 2022-04-15 DIAGNOSIS — C221 Intrahepatic bile duct carcinoma: Secondary | ICD-10-CM

## 2022-04-18 ENCOUNTER — Encounter: Payer: Self-pay | Admitting: Oncology

## 2022-04-18 ENCOUNTER — Other Ambulatory Visit: Payer: Self-pay

## 2022-04-18 ENCOUNTER — Ambulatory Visit (HOSPITAL_COMMUNITY)
Admission: RE | Admit: 2022-04-18 | Discharge: 2022-04-18 | Disposition: A | Discharge status: Home / Self Care | Source: Ambulatory Visit | Attending: Oncology | Admitting: Oncology

## 2022-04-18 DIAGNOSIS — C221 Intrahepatic bile duct carcinoma: Secondary | ICD-10-CM

## 2022-04-18 DIAGNOSIS — R188 Other ascites: Secondary | ICD-10-CM | POA: Insufficient documentation

## 2022-04-18 HISTORY — PX: IR PARACENTESIS: IMG2679

## 2022-04-18 MED ORDER — LIDOCAINE HCL 1 % IJ SOLN
INTRAMUSCULAR | Status: AC
  Filled 2022-04-18: qty 20

## 2022-04-24 ENCOUNTER — Telehealth: Payer: Self-pay

## 2022-04-24 NOTE — Telephone Encounter (Signed)
Jessica from [employer] called in stated [name]  is having dizziness spells . The dizziness started yesterday, when she walk the dizziness get worst. B/P 109/52, no short of breath,or fever. She is not taking her potassium. The patient is ok when laying down. I called and spoke with the patient's daughter and she stated her mother is ok, and she will call me tomorrow for an updated. I advice the patient to take in fluid, if she is dizziness do not stand.

## 2022-04-25 ENCOUNTER — Ambulatory Visit (HOSPITAL_COMMUNITY)
Admission: RE | Admit: 2022-04-25 | Discharge: 2022-04-25 | Disposition: A | Discharge status: Home / Self Care | Source: Ambulatory Visit | Attending: Oncology | Admitting: Oncology

## 2022-04-25 DIAGNOSIS — R188 Other ascites: Secondary | ICD-10-CM | POA: Diagnosis not present

## 2022-04-25 DIAGNOSIS — C221 Intrahepatic bile duct carcinoma: Secondary | ICD-10-CM | POA: Diagnosis present

## 2022-04-25 HISTORY — PX: IR PARACENTESIS: IMG2679

## 2022-04-25 MED ORDER — LIDOCAINE HCL (PF) 1 % IJ SOLN
INTRAMUSCULAR | Status: DC | PRN
  Administered 2022-04-25: 10 mL

## 2022-04-25 MED ORDER — LIDOCAINE HCL 1 % IJ SOLN
INTRAMUSCULAR | Status: AC
  Filled 2022-04-25: qty 20

## 2022-04-25 NOTE — Procedures (Signed)
PROCEDURE SUMMARY:  Successful US guided therapeutic paracentesis from RLQ.  Yielded 1.8 L of clear, yellow fluid.  No immediate complications.  Pt tolerated well.   Specimen not sent for labs.  EBL < 1 mL  Tyson Alias, AGNP 04/25/2022 2:22 PM

## 2022-04-26 ENCOUNTER — Encounter: Payer: Self-pay | Admitting: Nurse Practitioner

## 2022-04-26 ENCOUNTER — Telehealth: Payer: Self-pay

## 2022-04-26 ENCOUNTER — Inpatient Hospital Stay: Payer: Medicare Other | Attending: Nurse Practitioner | Admitting: Nurse Practitioner

## 2022-04-26 VITALS — BP 116/64 | HR 74 | Temp 98.1°F | Resp 18 | Wt 124.0 lb

## 2022-04-26 DIAGNOSIS — R42 Dizziness and giddiness: Secondary | ICD-10-CM | POA: Diagnosis not present

## 2022-04-26 DIAGNOSIS — Z8041 Family history of malignant neoplasm of ovary: Secondary | ICD-10-CM | POA: Insufficient documentation

## 2022-04-26 DIAGNOSIS — R197 Diarrhea, unspecified: Secondary | ICD-10-CM | POA: Diagnosis not present

## 2022-04-26 DIAGNOSIS — R5383 Other fatigue: Secondary | ICD-10-CM | POA: Diagnosis not present

## 2022-04-26 DIAGNOSIS — K625 Hemorrhage of anus and rectum: Secondary | ICD-10-CM | POA: Insufficient documentation

## 2022-04-26 DIAGNOSIS — R634 Abnormal weight loss: Secondary | ICD-10-CM | POA: Insufficient documentation

## 2022-04-26 DIAGNOSIS — C221 Intrahepatic bile duct carcinoma: Secondary | ICD-10-CM | POA: Insufficient documentation

## 2022-04-26 DIAGNOSIS — E271 Primary adrenocortical insufficiency: Secondary | ICD-10-CM | POA: Insufficient documentation

## 2022-04-26 DIAGNOSIS — E039 Hypothyroidism, unspecified: Secondary | ICD-10-CM | POA: Insufficient documentation

## 2022-04-26 NOTE — Progress Notes (Signed)
[place] OFFICE PROGRESS NOTE   Diagnosis: Cholangiocarcinoma  INTERVAL HISTORY:   [name] returns prior to scheduled follow-up for evaluation of dizziness.  She reports falling at 4 AM yesterday when she got up to go to the bathroom.  She has had intermittent mild dizziness for about a week now.  The dizziness is associated with position change.  She feels fluid intake is not adequate and she is going to try to correct this.  No nausea or vomiting.  No diarrhea.  No fever.  No unusual headaches.  No double vision.  Overall good appetite.  She estimates having a paracentesis on a weekly basis.  Objective:  Vital signs in last 24 hours:  Blood pressure (!) 123/55, pulse 76, temperature 98.1 F (36.7 C), temperature source Oral, resp. rate 18, weight 124 lb (56.2 kg), SpO2 98 %.    HEENT: No thrush or ulcers.  Mucous membranes are moist. Resp: Lungs clear bilaterally. Cardio: Regular rate and rhythm. GI: Abdomen soft, distended.  Liver edge is palpable in the right upper abdomen with associated tenderness. Vascular: No leg edema. Neuro: Alert and oriented.  Follows commands.  Motor strength 5/5.  Extraocular movements intact. Skin: Decreased skin turgor.   Lab Results:  Lab Results  Component Value Date   WBC 12.7 (H) 04/06/2022   HGB 11.4 (L) 04/06/2022   HCT 35.8 (L) 04/06/2022   MCV 87.7 04/06/2022   PLT 241 04/06/2022   NEUTROABS 9.9 (H) 04/06/2022    Imaging:  IR Paracentesis  Result Date: 04/25/2022 INDICATION: History of cholangiocarcinoma with recurrent ascites. Request for therapeutic paracentesis. EXAM: ULTRASOUND GUIDED THERAPEUTIC RIGHT LOWER QUADRANT PARACENTESIS MEDICATIONS: 10 mL 1 % lidocaine COMPLICATIONS: None immediate. PROCEDURE: Informed written consent was obtained from the patient after a discussion of the risks, benefits and alternatives to treatment. A timeout was performed prior to the initiation of the procedure. Initial  ultrasound scanning demonstrates a small amount of ascites within the right lower abdominal quadrant. The right lower abdomen was prepped and draped in the usual sterile fashion. 1% lidocaine was used for local anesthesia. Following this, a 19 gauge, 7-cm, Yueh catheter was introduced. An ultrasound image was saved for documentation purposes. The paracentesis was performed. The catheter was removed and a dressing was applied. The patient tolerated the procedure well without immediate post procedural complication. FINDINGS: A total of approximately 1.8 L of clear, yellow fluid was removed. IMPRESSION: Successful ultrasound-guided paracentesis yielding 1.8 liters of peritoneal fluid. Read by: Narda Rutherford, AGNP-BC Electronically Signed   By: Aletta Edouard M.D.   On: 04/25/2022 15:04    Medications: I have reviewed the patient's current medications.  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 CT abdomen/pelvis 12/21/2020-large right liver mass with peripheral intrahepatic biliary duct dilatation in the right liver, expansile tumor thrombus occluding the main, left, and right portal veins, mild porta hepatis lymphadenopathy, indeterminate 2 cm hypodense right renal lesion, small volume ascites MRI abdomen 12/30/2020-9.4 x 6.8 cm liver mass centered in segment 5 with involvement of segments 4A and 8, peripheral intrahepatic biliary ductal dilatation, expansile occlusive tumor thrombus in the main, left, right portal veins, mass characterized as LR-M, solitary 0.9 cm segment 2 lesion suspicious for metastasis, mildly enlarged portacaval node, small volume ascites, right renal cortical mass-renal cell carcinoma not excluded Ultrasound-guided biopsy of right liver mass on 01/09/2021-pathology refer to [facility]- carcinoma with predominantly glandular and small foci of hepatocellular differentiation cholangiocarcinoma favored, NGS-IDH1 alteration, MSS, tumor mutation burden  3 Cycle 1 gemcitabine/cisplatin 04/13/2021, day 8 04/19/2021 Cycle 2 gemcitabine/cisplatin 05/02/2021, treatment changed to an every 2-week schedule Gemcitabine alone 05/02/2021 Chemotherapy discontinued per patient preference CT abdomen/pelvis 07/22/2021-moderate ascites, enlargement of ill marginated liver mass occupying the right liver and extending to the caudate with tumor extension into the hepatic veins and right portal vein Therapeutic paracentesis, last 04/02/2022   Addison's disease Anorexia/weight loss secondary to #1 Family history of ovarian cancer Hypothyroidism Intermittent diarrhea-related to the right liver mass? Report of intermittent small-volume rectal bleeding Severe fatigue/malaise, question related to cisplatin/chemotherapy versus cancer-improved  Disposition: [name] appears stable.  She has intrahepatic cholangiocarcinoma and is enrolled in the home hospice program.  She is seen today in an unscheduled visit for evaluation of mild intermittent positional dizziness.  She is intact neurologically.  She may be mildly dehydrated due to poor oral intake.  The frequent paracentesis procedures may also be contributing.  She will try to increase fluid intake.  She will contact the office if the dizziness worsens.  Otherwise we will plan to see her at her next scheduled visit in 1 month.  Patient seen with Dr. Benay Spice.    Ned Card ANP/GNP-BC   04/26/2022  10:17 AM This was a shared visit with Ned Card.  Ms. Salvas is enrolled in hospice care.  She undergoes intermittent paracentesis procedures for relief of abdominal discomfort.  She reports recent dizziness.  I suspect this is related to malnutrition and decreased intravascular volume status.  I was present for greater than 50% of today's visit.  I performed medical decision making.  Julieanne Manson, MD

## 2022-04-26 NOTE — Telephone Encounter (Signed)
Mrs. Kilmartin 's daughter called in and stated he mother is still having dizziness spell and she feel like she need to come in for a visit. I also received a call from Klemme from [place] and stated the patient B/P 11/73/,134/80, she fall going to the bathroom. She also feel like the patient needs to come in for an office visit. The patient schedule to come in today at 10.

## 2022-04-29 ENCOUNTER — Other Ambulatory Visit: Payer: Self-pay

## 2022-04-29 ENCOUNTER — Encounter: Payer: Self-pay | Admitting: Oncology

## 2022-04-29 DIAGNOSIS — C221 Intrahepatic bile duct carcinoma: Secondary | ICD-10-CM

## 2022-05-02 ENCOUNTER — Ambulatory Visit (HOSPITAL_COMMUNITY)
Admission: RE | Admit: 2022-05-02 | Discharge: 2022-05-02 | Disposition: A | Discharge status: Home / Self Care | Source: Ambulatory Visit | Attending: Oncology | Admitting: Oncology

## 2022-05-02 DIAGNOSIS — R188 Other ascites: Secondary | ICD-10-CM | POA: Insufficient documentation

## 2022-05-02 DIAGNOSIS — C221 Intrahepatic bile duct carcinoma: Secondary | ICD-10-CM | POA: Insufficient documentation

## 2022-05-02 HISTORY — PX: IR PARACENTESIS: IMG2679

## 2022-05-02 MED ORDER — LIDOCAINE HCL 1 % IJ SOLN
INTRAMUSCULAR | Status: AC
  Filled 2022-05-02: qty 20

## 2022-05-02 NOTE — Procedures (Signed)
PROCEDURE SUMMARY:  Successful image-guided paracentesis from the right lower abdomen.  Yielded 2.3 liters of clear, yellow fluid.  No immediate complications.  EBL = trace. Patient tolerated well.   Specimen was not sent for labs.  Please see imaging section of Epic for full dictation.   Lura Em PA-C 05/02/2022 1:15 PM

## 2022-05-06 ENCOUNTER — Encounter: Payer: Self-pay | Admitting: Oncology

## 2022-05-06 ENCOUNTER — Other Ambulatory Visit: Payer: Self-pay

## 2022-05-06 DIAGNOSIS — C221 Intrahepatic bile duct carcinoma: Secondary | ICD-10-CM

## 2022-05-09 ENCOUNTER — Ambulatory Visit (HOSPITAL_COMMUNITY)
Admission: RE | Admit: 2022-05-09 | Discharge: 2022-05-09 | Disposition: A | Discharge status: Home / Self Care | Source: Ambulatory Visit | Attending: Oncology | Admitting: Oncology

## 2022-05-09 ENCOUNTER — Encounter: Payer: Self-pay | Admitting: Oncology

## 2022-05-09 DIAGNOSIS — R180 Malignant ascites: Secondary | ICD-10-CM | POA: Diagnosis not present

## 2022-05-09 DIAGNOSIS — C221 Intrahepatic bile duct carcinoma: Secondary | ICD-10-CM | POA: Insufficient documentation

## 2022-05-09 HISTORY — PX: IR PARACENTESIS: IMG2679

## 2022-05-09 MED ORDER — LIDOCAINE HCL 1 % IJ SOLN
INTRAMUSCULAR | Status: AC
  Filled 2022-05-09: qty 20

## 2022-05-09 MED ORDER — LIDOCAINE HCL 1 % IJ SOLN
INTRAMUSCULAR | Status: DC | PRN
  Administered 2022-05-09: 5 mL via INTRADERMAL

## 2022-05-09 NOTE — Procedures (Signed)
PROCEDURE SUMMARY:  Successful image-guided paracentesis from the right lower abdomen.  Yielded 1.5 liters of hazy yellow fluid.  No immediate complications.  EBL = trace. Patient tolerated well.   Specimen was not sent for labs.  Please see imaging section of Epic for full dictation.   Armando Gang Melanny Wire PA-C 05/09/2022 10:11 AM

## 2022-05-10 ENCOUNTER — Other Ambulatory Visit: Payer: Self-pay

## 2022-05-10 ENCOUNTER — Encounter: Payer: Self-pay | Admitting: Oncology

## 2022-05-10 DIAGNOSIS — C221 Intrahepatic bile duct carcinoma: Secondary | ICD-10-CM

## 2022-05-16 ENCOUNTER — Ambulatory Visit (HOSPITAL_COMMUNITY)
Admission: RE | Admit: 2022-05-16 | Discharge: 2022-05-16 | Disposition: A | Discharge status: Home / Self Care | Source: Ambulatory Visit | Attending: Oncology | Admitting: Oncology

## 2022-05-16 DIAGNOSIS — C221 Intrahepatic bile duct carcinoma: Secondary | ICD-10-CM | POA: Diagnosis present

## 2022-05-16 DIAGNOSIS — R188 Other ascites: Secondary | ICD-10-CM | POA: Insufficient documentation

## 2022-05-16 HISTORY — PX: IR PARACENTESIS: IMG2679

## 2022-05-16 MED ORDER — LIDOCAINE HCL 1 % IJ SOLN
INTRAMUSCULAR | Status: AC
  Administered 2022-05-16: 10 mL
  Filled 2022-05-16: qty 20

## 2022-05-16 NOTE — Procedures (Signed)
PROCEDURE SUMMARY:  Successful US guided therapeutic paracentesis from RUQ.  Yielded 2 L of clear, yellow fluid.  No immediate complications.  Pt tolerated well.   Specimen not sent for labs.  EBL < 1 mL  Tyson Alias, AGNP 05/16/2022 2:07 PM

## 2022-05-20 ENCOUNTER — Other Ambulatory Visit: Payer: Self-pay

## 2022-05-20 ENCOUNTER — Encounter: Payer: Self-pay | Admitting: Oncology

## 2022-05-20 DIAGNOSIS — C221 Intrahepatic bile duct carcinoma: Secondary | ICD-10-CM

## 2022-05-22 ENCOUNTER — Encounter: Payer: Self-pay | Admitting: Nurse Practitioner

## 2022-05-22 ENCOUNTER — Inpatient Hospital Stay: Attending: Nurse Practitioner | Admitting: Nurse Practitioner

## 2022-05-22 VITALS — BP 117/56 | HR 83 | Temp 98.2°F | Resp 20 | Ht 66.0 in | Wt 124.0 lb

## 2022-05-22 DIAGNOSIS — Z8041 Family history of malignant neoplasm of ovary: Secondary | ICD-10-CM | POA: Insufficient documentation

## 2022-05-22 DIAGNOSIS — E271 Primary adrenocortical insufficiency: Secondary | ICD-10-CM | POA: Insufficient documentation

## 2022-05-22 DIAGNOSIS — E039 Hypothyroidism, unspecified: Secondary | ICD-10-CM | POA: Diagnosis not present

## 2022-05-22 DIAGNOSIS — C221 Intrahepatic bile duct carcinoma: Secondary | ICD-10-CM | POA: Diagnosis not present

## 2022-05-22 NOTE — Progress Notes (Signed)
  [place] OFFICE PROGRESS NOTE   Diagnosis: Cholangiocarcinoma  INTERVAL HISTORY:   [name] returns as scheduled.  She continues to have intermittent dizziness, mildly improved.  She has meclizine to take as needed.  Bowels are moving.  She had mild nausea this morning which has since resolved.  She describes her appetite as "pretty good".  She is scheduled for a paracentesis procedure 05/23/2022.  Objective:  Vital signs in last 24 hours:  Blood pressure (!) 117/56, pulse 83, temperature 98.2 F (36.8 C), temperature source Oral, resp. rate 20, height $RemoveBe'5\' 6"'AklDkiKZu$  (1.676 m), weight 124 lb (56.2 kg), SpO2 98 %.    HEENT: No thrush or ulcers. Resp: Lungs clear bilaterally. Cardio: Regular rate and rhythm. GI: Abdomen is soft, distended.  Liver edge is palpable. Vascular: No leg edema. Lab Results:  Lab Results  Component Value Date   WBC 12.7 (H) 04/06/2022   HGB 11.4 (L) 04/06/2022   HCT 35.8 (L) 04/06/2022   MCV 87.7 04/06/2022   PLT 241 04/06/2022   NEUTROABS 9.9 (H) 04/06/2022    Imaging:  No results found.  Medications: I have reviewed the patient's current medications.  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 CT abdomen/pelvis 12/21/2020-large right liver mass with peripheral intrahepatic biliary duct dilatation in the right liver, expansile tumor thrombus occluding the main, left, and right portal veins, mild porta hepatis lymphadenopathy, indeterminate 2 cm hypodense right renal lesion, small volume ascites MRI abdomen 12/30/2020-9.4 x 6.8 cm liver mass centered in segment 5 with involvement of segments 4A and 8, peripheral intrahepatic biliary ductal dilatation, expansile occlusive tumor thrombus in the main, left, right portal veins, mass characterized as LR-M, solitary 0.9 cm segment 2 lesion suspicious for metastasis, mildly enlarged portacaval node, small volume ascites, right renal cortical  mass-renal cell carcinoma not excluded Ultrasound-guided biopsy of right liver mass on 01/09/2021-pathology refer to [facility]- carcinoma with predominantly glandular and small foci of hepatocellular differentiation cholangiocarcinoma favored, NGS-IDH1 alteration, MSS, tumor mutation burden 3 Cycle 1 gemcitabine/cisplatin 04/13/2021, day 8 04/19/2021 Cycle 2 gemcitabine/cisplatin 05/02/2021, treatment changed to an every 2-week schedule Gemcitabine alone 05/02/2021 Chemotherapy discontinued per patient preference CT abdomen/pelvis 07/22/2021-moderate ascites, enlargement of ill marginated liver mass occupying the right liver and extending to the caudate with tumor extension into the hepatic veins and right portal vein Therapeutic paracentesis, last 04/02/2022   Addison's disease Anorexia/weight loss secondary to #1 Family history of ovarian cancer Hypothyroidism Intermittent diarrhea-related to the right liver mass? Report of intermittent small-volume rectal bleeding Severe fatigue/malaise, question related to cisplatin/chemotherapy versus cancer-improved  Disposition: [name] appears unchanged.  She is enrolled in the home hospice program.  Plan to continue supportive/comfort care.  She will return for follow-up in 6 weeks.    Ned Card ANP/GNP-BC   05/22/2022  2:45 PM

## 2022-05-23 ENCOUNTER — Inpatient Hospital Stay: Admitting: Oncology

## 2022-05-23 ENCOUNTER — Encounter: Payer: Self-pay | Admitting: Oncology

## 2022-05-23 ENCOUNTER — Ambulatory Visit (HOSPITAL_COMMUNITY)
Admission: RE | Admit: 2022-05-23 | Discharge: 2022-05-23 | Disposition: A | Discharge status: Home / Self Care | Source: Ambulatory Visit | Attending: Oncology | Admitting: Oncology

## 2022-05-23 DIAGNOSIS — C221 Intrahepatic bile duct carcinoma: Secondary | ICD-10-CM | POA: Diagnosis not present

## 2022-05-23 DIAGNOSIS — R188 Other ascites: Secondary | ICD-10-CM | POA: Diagnosis not present

## 2022-05-23 HISTORY — PX: IR PARACENTESIS: IMG2679

## 2022-05-23 MED ORDER — LIDOCAINE HCL 1 % IJ SOLN
INTRAMUSCULAR | Status: AC
  Administered 2022-05-23: 15 mL
  Filled 2022-05-23: qty 20

## 2022-05-23 NOTE — Procedures (Signed)
PROCEDURE SUMMARY:  Successful US guided diagnostic paracentesis from RLQ.  Yielded 2.4 L of clear, yellow fluid.  No immediate complications.  Pt tolerated well.   Specimen not sent for labs.  EBL < 1 mL  Tyson Alias, AGNP 05/23/2022 12:36 PM

## 2022-05-24 ENCOUNTER — Other Ambulatory Visit: Payer: Self-pay

## 2022-05-24 DIAGNOSIS — C221 Intrahepatic bile duct carcinoma: Secondary | ICD-10-CM

## 2022-06-03 ENCOUNTER — Ambulatory Visit (HOSPITAL_COMMUNITY)
Admission: RE | Admit: 2022-06-03 | Discharge: 2022-06-03 | Disposition: A | Discharge status: Home / Self Care | Source: Ambulatory Visit | Attending: Oncology | Admitting: Oncology

## 2022-06-03 DIAGNOSIS — C221 Intrahepatic bile duct carcinoma: Secondary | ICD-10-CM | POA: Diagnosis present

## 2022-06-03 DIAGNOSIS — R188 Other ascites: Secondary | ICD-10-CM | POA: Insufficient documentation

## 2022-06-03 HISTORY — PX: IR PARACENTESIS: IMG2679

## 2022-06-03 MED ORDER — LIDOCAINE HCL 1 % IJ SOLN
INTRAMUSCULAR | Status: AC
  Filled 2022-06-03: qty 20

## 2022-06-03 NOTE — Procedures (Signed)
PROCEDURE SUMMARY:  Successful US guided paracentesis from right lateral abdomen.  Yielded 1.4 liters of yellow fluid.  No immediate complications.  Pt tolerated well.   Specimen was not sent for labs.  EBL < 67m  KDocia BarrierPA-C 06/03/2022 10:46 AM

## 2022-06-04 ENCOUNTER — Encounter: Payer: Self-pay | Admitting: Oncology

## 2022-06-05 ENCOUNTER — Other Ambulatory Visit: Payer: Self-pay

## 2022-06-05 DIAGNOSIS — C221 Intrahepatic bile duct carcinoma: Secondary | ICD-10-CM

## 2022-06-06 ENCOUNTER — Other Ambulatory Visit: Payer: Self-pay | Admitting: Oncology

## 2022-06-06 MED ORDER — POTASSIUM CHLORIDE CRYS ER 20 MEQ PO TBCR: 20.0000 meq | EXTENDED_RELEASE_TABLET | Freq: Two times a day (BID) | ORAL | 0 refills | Status: DC

## 2022-06-10 ENCOUNTER — Ambulatory Visit (HOSPITAL_COMMUNITY)
Admission: RE | Admit: 2022-06-10 | Discharge: 2022-06-10 | Disposition: A | Discharge status: Home / Self Care | Source: Ambulatory Visit | Attending: Oncology | Admitting: Oncology

## 2022-06-10 DIAGNOSIS — R188 Other ascites: Secondary | ICD-10-CM | POA: Diagnosis present

## 2022-06-10 DIAGNOSIS — C221 Intrahepatic bile duct carcinoma: Secondary | ICD-10-CM

## 2022-06-10 HISTORY — PX: IR PARACENTESIS: IMG2679

## 2022-06-10 MED ORDER — LIDOCAINE HCL 1 % IJ SOLN
INTRAMUSCULAR | Status: AC
  Administered 2022-06-10: 10 mL
  Filled 2022-06-10: qty 20

## 2022-06-10 NOTE — Procedures (Signed)
PROCEDURE SUMMARY:  Successful US guided paracentesis from right lateral abdomen.  Yielded 2 liters of clear yellow fluid.  No immediate complications.  Patient tolerated well.  EBL = trace  Grady Mohabir S Shebra Muldrow PA-C 06/10/2022 2:40 PM

## 2022-06-11 ENCOUNTER — Telehealth: Payer: Self-pay

## 2022-06-11 ENCOUNTER — Other Ambulatory Visit: Payer: Self-pay

## 2022-06-11 ENCOUNTER — Encounter: Payer: Self-pay | Admitting: Oncology

## 2022-06-11 DIAGNOSIS — C221 Intrahepatic bile duct carcinoma: Secondary | ICD-10-CM

## 2022-06-11 NOTE — Telephone Encounter (Signed)
Paracentesis scheduled

## 2022-06-18 ENCOUNTER — Ambulatory Visit (HOSPITAL_COMMUNITY)
Admission: RE | Admit: 2022-06-18 | Discharge: 2022-06-18 | Disposition: A | Discharge status: Home / Self Care | Source: Ambulatory Visit | Attending: Oncology | Admitting: Oncology

## 2022-06-18 DIAGNOSIS — C221 Intrahepatic bile duct carcinoma: Secondary | ICD-10-CM | POA: Insufficient documentation

## 2022-06-18 DIAGNOSIS — R188 Other ascites: Secondary | ICD-10-CM | POA: Diagnosis not present

## 2022-06-18 HISTORY — PX: IR PARACENTESIS: IMG2679

## 2022-06-18 MED ORDER — LIDOCAINE HCL (PF) 1 % IJ SOLN
INTRAMUSCULAR | Status: AC | PRN
  Administered 2022-06-18: 5 mL

## 2022-06-18 MED ORDER — LIDOCAINE HCL 1 % IJ SOLN
INTRAMUSCULAR | Status: AC
  Filled 2022-06-18: qty 20

## 2022-06-21 ENCOUNTER — Other Ambulatory Visit: Payer: Self-pay | Admitting: *Deleted

## 2022-06-21 ENCOUNTER — Encounter: Payer: Self-pay | Admitting: Oncology

## 2022-06-21 DIAGNOSIS — C221 Intrahepatic bile duct carcinoma: Secondary | ICD-10-CM

## 2022-06-21 NOTE — Progress Notes (Signed)
Daughter requesting paracentesis for patient on Wednesday at [facility] after 10 am.  Scheduled for 9/13 at 1 pm with 12:30 arrival. Enter via Entrance C and check in in the radiology desk. Daughter notified via [place].

## 2022-06-26 ENCOUNTER — Other Ambulatory Visit (HOSPITAL_COMMUNITY): Payer: Medicare Other

## 2022-06-26 ENCOUNTER — Encounter: Payer: Self-pay | Admitting: Oncology

## 2022-07-01 ENCOUNTER — Ambulatory Visit (HOSPITAL_COMMUNITY)
Admission: RE | Admit: 2022-07-01 | Discharge: 2022-07-01 | Disposition: A | Discharge status: Home / Self Care | Payer: Medicare Other | Source: Ambulatory Visit | Attending: Nurse Practitioner | Admitting: Nurse Practitioner

## 2022-07-01 DIAGNOSIS — R188 Other ascites: Secondary | ICD-10-CM | POA: Diagnosis not present

## 2022-07-01 DIAGNOSIS — C221 Intrahepatic bile duct carcinoma: Secondary | ICD-10-CM

## 2022-07-01 HISTORY — PX: IR PARACENTESIS: IMG2679

## 2022-07-01 MED ORDER — LIDOCAINE HCL (PF) 1 % IJ SOLN
INTRAMUSCULAR | Status: DC | PRN
  Administered 2022-07-01: 10 mL

## 2022-07-01 NOTE — Procedures (Signed)
PROCEDURE SUMMARY:  Successful US guided therapeutic paracentesis from RUQ.  Yielded 2 L of clear, yellow fluid.  No immediate complications.  Pt tolerated well.   Specimen not sent for labs.  EBL < 1 mL  Tyson Alias, AGNP 07/01/2022 10:38 AM

## 2022-07-03 ENCOUNTER — Inpatient Hospital Stay: Payer: Medicare Other | Admitting: Oncology

## 2022-07-04 ENCOUNTER — Encounter: Payer: Self-pay | Admitting: Oncology

## 2022-07-04 ENCOUNTER — Other Ambulatory Visit: Payer: Self-pay

## 2022-07-04 DIAGNOSIS — C221 Intrahepatic bile duct carcinoma: Secondary | ICD-10-CM

## 2022-07-08 ENCOUNTER — Encounter: Payer: Self-pay | Admitting: Oncology

## 2022-07-11 ENCOUNTER — Ambulatory Visit (HOSPITAL_COMMUNITY)
Admission: RE | Admit: 2022-07-11 | Discharge: 2022-07-11 | Disposition: A | Discharge status: Home / Self Care | Source: Ambulatory Visit | Attending: Oncology | Admitting: Oncology

## 2022-07-11 DIAGNOSIS — R188 Other ascites: Secondary | ICD-10-CM | POA: Diagnosis not present

## 2022-07-11 DIAGNOSIS — C221 Intrahepatic bile duct carcinoma: Secondary | ICD-10-CM | POA: Diagnosis present

## 2022-07-11 HISTORY — PX: IR PARACENTESIS: IMG2679

## 2022-07-11 MED ORDER — LIDOCAINE HCL (PF) 1 % IJ SOLN
INTRAMUSCULAR | Status: DC | PRN
  Administered 2022-07-11: 10 mL

## 2022-07-11 NOTE — Procedures (Signed)
PROCEDURE SUMMARY:  Successful US guided paracentesis from right abdomen.  Yielded 2L of clear yellow fluid.  No immediate complications.  Pt tolerated well.   Specimen not sent for labs.  EBL < 2 mL  Theresa Duty, NP 07/11/2022 2:20 PM

## 2022-07-15 ENCOUNTER — Other Ambulatory Visit: Payer: Self-pay

## 2022-07-15 ENCOUNTER — Encounter: Payer: Self-pay | Admitting: Oncology

## 2022-07-15 DIAGNOSIS — C221 Intrahepatic bile duct carcinoma: Secondary | ICD-10-CM

## 2022-07-22 ENCOUNTER — Other Ambulatory Visit (HOSPITAL_COMMUNITY): Payer: Medicare Other

## 2022-07-22 ENCOUNTER — Encounter: Payer: Self-pay | Admitting: Oncology

## 2022-07-22 ENCOUNTER — Other Ambulatory Visit: Payer: Self-pay

## 2022-07-22 DIAGNOSIS — C221 Intrahepatic bile duct carcinoma: Secondary | ICD-10-CM

## 2022-07-23 ENCOUNTER — Telehealth: Payer: Self-pay

## 2022-07-23 NOTE — Telephone Encounter (Signed)
Jessica from [employer] called to report [name] had a fall on 07/17/2022 getting out of the bed.

## 2022-07-24 ENCOUNTER — Other Ambulatory Visit (HOSPITAL_COMMUNITY): Payer: Medicare Other

## 2022-07-24 ENCOUNTER — Ambulatory Visit (HOSPITAL_COMMUNITY)
Admission: RE | Admit: 2022-07-24 | Discharge: 2022-07-24 | Disposition: A | Discharge status: Home / Self Care | Source: Ambulatory Visit | Attending: Oncology | Admitting: Oncology

## 2022-07-24 DIAGNOSIS — C221 Intrahepatic bile duct carcinoma: Secondary | ICD-10-CM | POA: Diagnosis present

## 2022-07-24 DIAGNOSIS — R188 Other ascites: Secondary | ICD-10-CM | POA: Insufficient documentation

## 2022-07-24 HISTORY — PX: IR PARACENTESIS: IMG2679

## 2022-07-24 MED ORDER — LIDOCAINE HCL 1 % IJ SOLN
INTRAMUSCULAR | Status: AC
  Filled 2022-07-24: qty 20

## 2022-07-25 ENCOUNTER — Inpatient Hospital Stay: Payer: Medicare Other | Attending: Nurse Practitioner | Admitting: Oncology

## 2022-07-25 VITALS — BP 103/55 | HR 72 | Temp 98.2°F | Resp 18 | Wt 107.8 lb

## 2022-07-25 DIAGNOSIS — Z8041 Family history of malignant neoplasm of ovary: Secondary | ICD-10-CM | POA: Insufficient documentation

## 2022-07-25 DIAGNOSIS — E039 Hypothyroidism, unspecified: Secondary | ICD-10-CM | POA: Diagnosis not present

## 2022-07-25 DIAGNOSIS — R188 Other ascites: Secondary | ICD-10-CM | POA: Insufficient documentation

## 2022-07-25 DIAGNOSIS — C221 Intrahepatic bile duct carcinoma: Secondary | ICD-10-CM | POA: Diagnosis not present

## 2022-07-25 MED ORDER — TRAMADOL-ACETAMINOPHEN 37.5-325 MG PO TABS: 1.0000 | ORAL_TABLET | Freq: Four times a day (QID) | ORAL | 0 refills | Status: AC | PRN

## 2022-07-25 NOTE — Progress Notes (Signed)
[place] OFFICE PROGRESS NOTE   Diagnosis: Cholangiocarcinoma  INTERVAL HISTORY:   [name] returns as scheduled.  She continues weekly follow-up with the hospice RN.  She is here today with her daughter.  [name] is weak.  She stays in bed most of the time.  She has a poor appetite.  She has discomfort at the right lower anterior chest and upper abdomen.  She is not taking pain medication.  She continues intermittent palliative paracentesis procedures, last for 2 L yesterday.  She is currently not receiving much relief of discomfort from the paracentesis.  She has developed a raised lesion at the left lower leg over the past month.  Objective:  Vital signs in last 24 hours:  Blood pressure (!) 103/55, pulse 72, temperature 98.2 F (36.8 C), temperature source Oral, resp. rate 18, weight 107 lb 12.8 oz (48.9 kg), SpO2 96 %.    HEENT: No thrush Resp: Lungs clear bilaterally Cardio: Regular rate and rhythm GI: The liver edge is palpable in the right upper abdomen with associated tenderness.  Ascites is present. Vascular: No leg edema  Skin: Round raised 1.5 cm lesion at the lateral left lower leg  Portacath/PICC-without erythema  Lab Results:  Lab Results  Component Value Date   WBC 12.7 (H) 04/06/2022   HGB 11.4 (L) 04/06/2022   HCT 35.8 (L) 04/06/2022   MCV 87.7 04/06/2022   PLT 241 04/06/2022   NEUTROABS 9.9 (H) 04/06/2022    CMP  Lab Results  Component Value Date   NA 139 04/06/2022   K 3.3 (L) 04/06/2022   CL 101 04/06/2022   CO2 26 04/06/2022   GLUCOSE 105 (H) 04/06/2022   BUN 24 (H) 04/06/2022   CREATININE 0.59 04/06/2022   CALCIUM 10.3 04/06/2022   PROT 6.2 (L) 04/06/2022   ALBUMIN 3.2 (L) 04/06/2022   AST 38 04/06/2022   ALT 21 04/06/2022   ALKPHOS 282 (H) 04/06/2022   BILITOT 0.5 04/06/2022   GFRNONAA >60 04/06/2022   GFRAA 64 (L) 11/03/2012      Imaging:  IR Paracentesis  Result Date: 07/24/2022 INDICATION:  Patient with history of cholangiocarcinoma and recurrent ascites. Request for therapeutic paracentesis. EXAM: ULTRASOUND GUIDED LEFT LOWER QUADRANT PARACENTESIS MEDICATIONS: 1% plain lidocaine, 5 mL COMPLICATIONS: None immediate. PROCEDURE: Informed written consent was obtained from the patient after a discussion of the risks, benefits and alternatives to treatment. A timeout was performed prior to the initiation of the procedure. Initial ultrasound scanning demonstrates a large amount of ascites within the left lower abdominal quadrant. The left lower abdomen was prepped and draped in the usual sterile fashion. 1% lidocaine was used for local anesthesia. Following this, a 6 Fr Safe-T-Centesis catheter was introduced. An ultrasound image was saved for documentation purposes. The paracentesis was performed. The catheter was removed and a dressing was applied. The patient tolerated the procedure well without immediate post procedural complication. FINDINGS: A total of approximately 2 L of clear yellow fluid was removed. IMPRESSION: Successful ultrasound-guided paracentesis yielding 2 liters of peritoneal fluid. Read by: Ascencion Dike PA-C Electronically Signed   By: Sandi Mariscal M.D.   On: 07/24/2022 15:53    Medications: I have reviewed the patient's current medications.   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 CT abdomen/pelvis 12/21/2020-large right liver mass with peripheral intrahepatic biliary duct dilatation in the right liver, expansile tumor thrombus occluding the main, left, and right portal veins, mild porta hepatis lymphadenopathy, indeterminate 2 cm hypodense  right renal lesion, small volume ascites MRI abdomen 12/30/2020-9.4 x 6.8 cm liver mass centered in segment 5 with involvement of segments 4A and 8, peripheral intrahepatic biliary ductal dilatation, expansile occlusive tumor thrombus in the main, left, right portal veins, mass characterized  as LR-M, solitary 0.9 cm segment 2 lesion suspicious for metastasis, mildly enlarged portacaval node, small volume ascites, right renal cortical mass-renal cell carcinoma not excluded Ultrasound-guided biopsy of right liver mass on 01/09/2021-pathology refer to [facility]- carcinoma with predominantly glandular and small foci of hepatocellular differentiation cholangiocarcinoma favored, NGS-IDH1 alteration, MSS, tumor mutation burden 3 Cycle 1 gemcitabine/cisplatin 04/13/2021, day 8 04/19/2021 Cycle 2 gemcitabine/cisplatin 05/02/2021, treatment changed to an every 2-week schedule Gemcitabine alone 05/02/2021 Chemotherapy discontinued per patient preference CT abdomen/pelvis 07/22/2021-moderate ascites, enlargement of ill marginated liver mass occupying the right liver and extending to the caudate with tumor extension into the hepatic veins and right portal vein Therapeutic paracentesis-multiple, last 07/24/2022   Addison's disease Anorexia/weight loss secondary to #1 Family history of ovarian cancer Hypothyroidism Intermittent diarrhea-related to the right liver mass? Report of intermittent small-volume rectal bleeding Severe fatigue/malaise, question related to cisplatin/chemotherapy versus cancer-improved    Disposition: [name] has cholangiocarcinoma.  She is enrolled in hospice care.  Her performance status is declining.  I recommended she discontinue paracentesis procedures since it is difficult for her to get out of the home and she is not getting significant relief with the paracentesis.  She will begin a trial of tramadol as needed for pain.  The lesion at the left lower leg may be a primary skin cancer or cutaneous metastasis.  She will observe the lesion and call for bleeding or pain.  [name] will be scheduled for a video visit in 6 weeks.  Betsy Coder, MD  07/25/2022  12:52 PM

## 2022-08-06 ENCOUNTER — Encounter: Payer: Self-pay | Admitting: Oncology

## 2022-08-29 ENCOUNTER — Inpatient Hospital Stay: Payer: Medicare Other | Admitting: Oncology

## 2023-06-26 IMAGING — US US PARACENTESIS
1 series · 7 of 7 positions shown · non-contrast
Comparison: none

INDICATION: History of hepatic masses recurrent ascites. Request for therapeutic
paracentesis.

[Series 1: us paracentesis mc & wl · 7 of 7 slices shown]
[im 1/7]
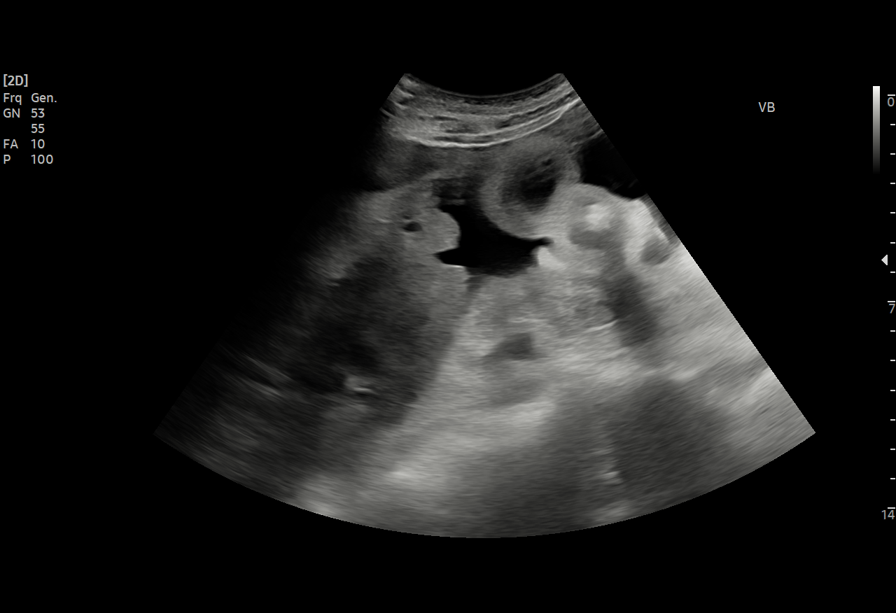
[im 2/7]
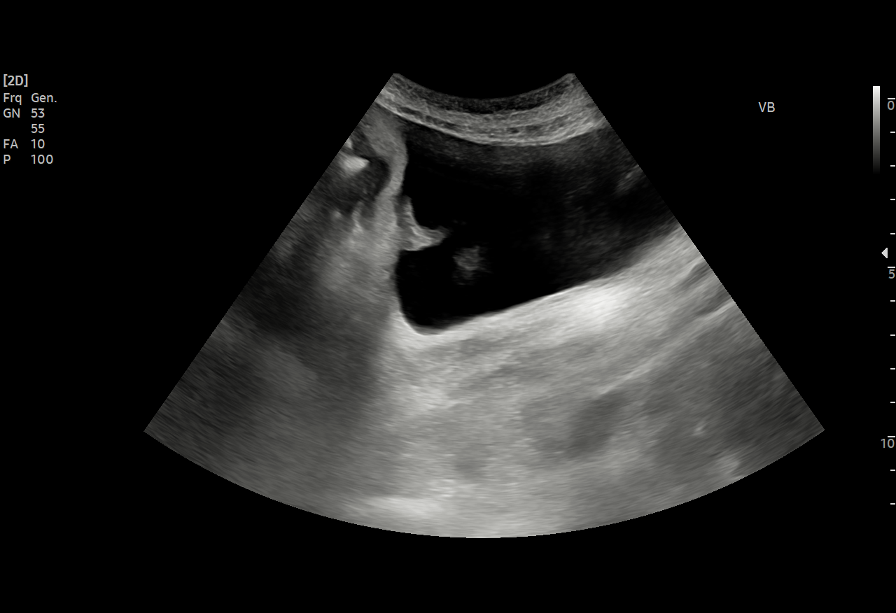
[im 3/7]
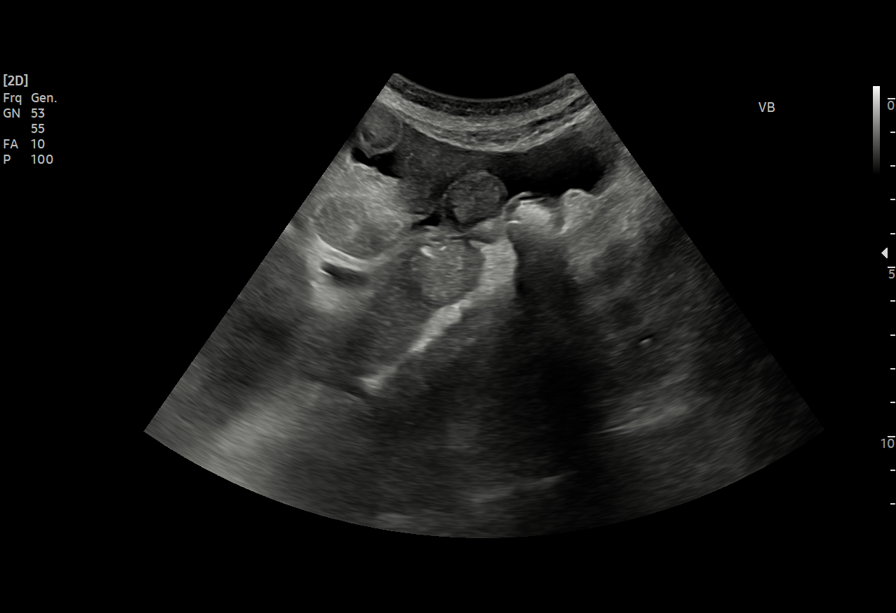
[im 4/7]
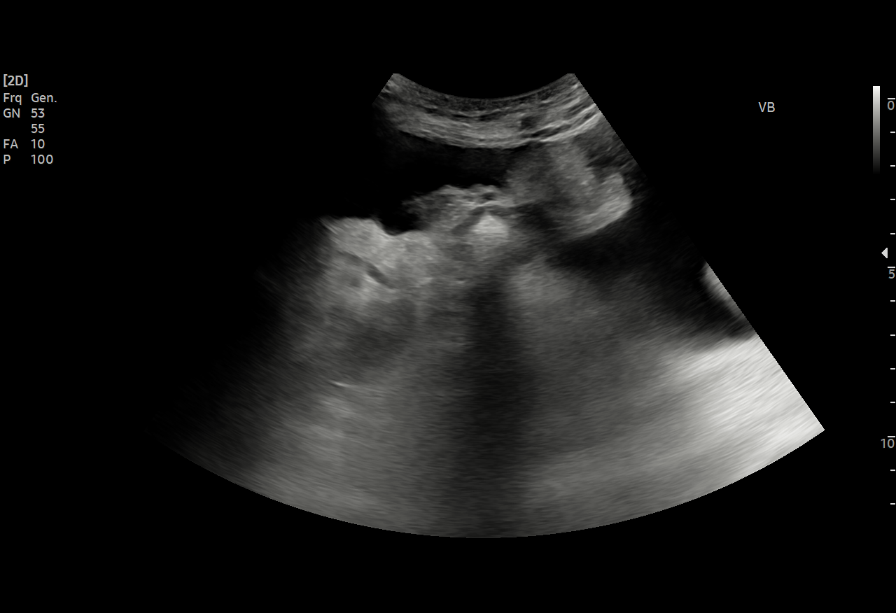
[im 5/7]
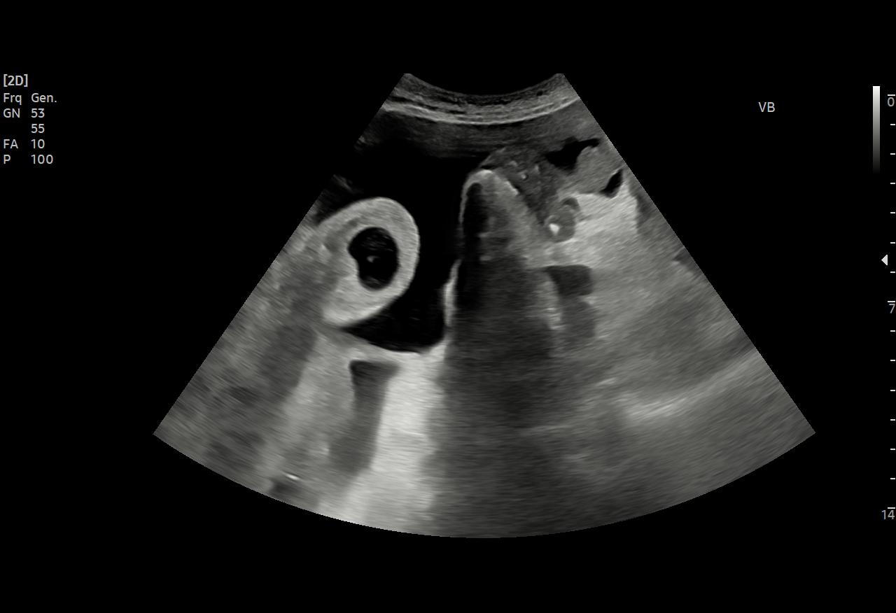
[im 6/7]
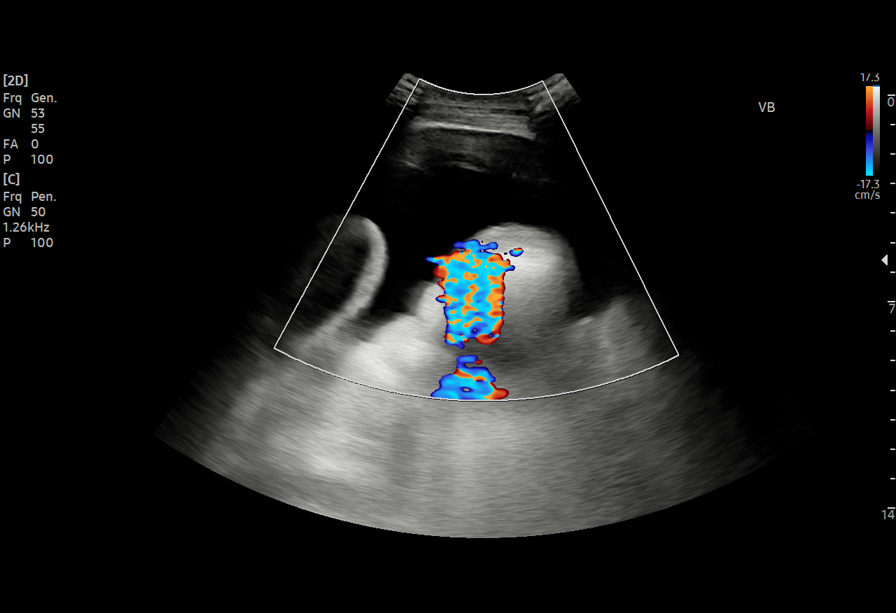
[im 7/7]
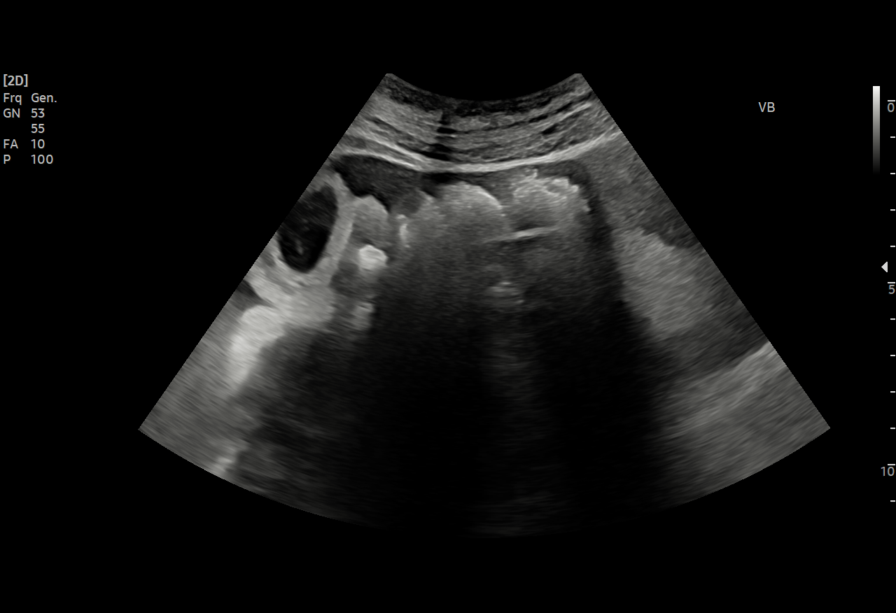

[7 of 7 positions shown; findings below may reference images not displayed]

EXAM:
ULTRASOUND GUIDED  PARACENTESIS

MEDICATIONS:
10 mL 1% lidocaine

COMPLICATIONS:
None immediate.

PROCEDURE:
Informed written consent was obtained from the patient after a
discussion of the risks, benefits and alternatives to treatment. A
timeout was performed prior to the initiation of the procedure.

Initial ultrasound scanning demonstrates a large amount of ascites
within the right lower abdominal quadrant. The right lower abdomen
was prepped and draped in the usual sterile fashion. 1% lidocaine
was used for local anesthesia.

Following this, a 19 gauge, 7-cm, Yueh catheter was introduced. An
ultrasound image was saved for documentation purposes. The
paracentesis was performed. The catheter was removed and a dressing
was applied. The patient tolerated the procedure well without
immediate post procedural complication.
FINDINGS: A total of approximately 1.9 L of clear yellow fluid was removed.
IMPRESSION: Successful ultrasound-guided paracentesis yielding 1.9 liters of
peritoneal fluid.
